# Patient Record
Sex: Male | Born: 1975 | Race: White | Hispanic: No | Marital: Married | Smoking: Never smoker
Health system: Southern US, Community
[De-identification: ages and names within clinical notes are randomized; demographics above are authoritative.]

## PROBLEM LIST (undated history)

## (undated) DIAGNOSIS — E785 Hyperlipidemia, unspecified: Secondary | ICD-10-CM

## (undated) DIAGNOSIS — J45990 Exercise induced bronchospasm: Secondary | ICD-10-CM

## (undated) DIAGNOSIS — E781 Pure hyperglyceridemia: Secondary | ICD-10-CM

## (undated) DIAGNOSIS — G4733 Obstructive sleep apnea (adult) (pediatric): Secondary | ICD-10-CM

## (undated) DIAGNOSIS — Z6834 Body mass index (BMI) 34.0-34.9, adult: Secondary | ICD-10-CM

## (undated) DIAGNOSIS — J309 Allergic rhinitis, unspecified: Secondary | ICD-10-CM

## (undated) DIAGNOSIS — I10 Essential (primary) hypertension: Secondary | ICD-10-CM

## (undated) DIAGNOSIS — F329 Major depressive disorder, single episode, unspecified: Secondary | ICD-10-CM

## (undated) DIAGNOSIS — K219 Gastro-esophageal reflux disease without esophagitis: Secondary | ICD-10-CM

## (undated) DIAGNOSIS — R635 Abnormal weight gain: Secondary | ICD-10-CM

## (undated) DIAGNOSIS — K2210 Ulcer of esophagus without bleeding: Secondary | ICD-10-CM

## (undated) DIAGNOSIS — F411 Generalized anxiety disorder: Secondary | ICD-10-CM

## (undated) DIAGNOSIS — D72819 Decreased white blood cell count, unspecified: Secondary | ICD-10-CM

## (undated) DIAGNOSIS — R002 Palpitations: Secondary | ICD-10-CM

## (undated) DIAGNOSIS — F10280 Alcohol dependence with alcohol-induced anxiety disorder: Secondary | ICD-10-CM

## (undated) DIAGNOSIS — M545 Low back pain, unspecified: Secondary | ICD-10-CM

## (undated) DIAGNOSIS — F32A Depression, unspecified: Secondary | ICD-10-CM

## (undated) HISTORY — DX: Ulcer of esophagus without bleeding: K22.10

## (undated) HISTORY — DX: Gastro-esophageal reflux disease without esophagitis: K21.9

## (undated) HISTORY — DX: Palpitations: R00.2

## (undated) HISTORY — DX: Low back pain, unspecified: M54.50

## (undated) HISTORY — PX: NASAL SINUS SURGERY: SHX719

## (undated) HISTORY — DX: Hyperlipidemia, unspecified: E78.5

## (undated) HISTORY — DX: Generalized anxiety disorder: F41.1

## (undated) HISTORY — DX: Body mass index (BMI) 34.0-34.9, adult: Z68.34

## (undated) HISTORY — DX: Exercise induced bronchospasm: J45.990

## (undated) HISTORY — DX: Alcohol dependence with alcohol-induced anxiety disorder: F10.280

## (undated) HISTORY — DX: Obstructive sleep apnea (adult) (pediatric): G47.33

## (undated) HISTORY — DX: Pure hyperglyceridemia: E78.1

## (undated) HISTORY — DX: Abnormal weight gain: R63.5

## (undated) HISTORY — DX: Decreased white blood cell count, unspecified: D72.819

## (undated) HISTORY — DX: Allergic rhinitis, unspecified: J30.9

## (undated) HISTORY — DX: Essential (primary) hypertension: I10

---

## 2002-03-31 ENCOUNTER — Emergency Department (HOSPITAL_COMMUNITY): Admission: EM | Admit: 2002-03-31 | Discharge: 2002-04-01 | Payer: Self-pay | Admitting: Emergency Medicine

## 2002-04-01 ENCOUNTER — Encounter: Payer: Self-pay | Admitting: Emergency Medicine

## 2006-07-12 ENCOUNTER — Emergency Department (HOSPITAL_COMMUNITY): Admission: EM | Admit: 2006-07-12 | Discharge: 2006-07-13 | Payer: Self-pay | Admitting: Emergency Medicine

## 2008-07-13 ENCOUNTER — Encounter: Admission: RE | Admit: 2008-07-13 | Discharge: 2008-07-13 | Payer: Self-pay | Admitting: Otolaryngology

## 2008-07-13 ENCOUNTER — Emergency Department (HOSPITAL_COMMUNITY): Admission: EM | Admit: 2008-07-13 | Discharge: 2008-07-14 | Payer: Self-pay | Admitting: Emergency Medicine

## 2008-08-28 ENCOUNTER — Ambulatory Visit (HOSPITAL_BASED_OUTPATIENT_CLINIC_OR_DEPARTMENT_OTHER): Admission: RE | Admit: 2008-08-28 | Discharge: 2008-08-28 | Payer: Self-pay | Admitting: Otolaryngology

## 2010-08-05 LAB — POCT HEMOGLOBIN-HEMACUE: Hemoglobin: 15.1 g/dL (ref 13.0–17.0)

## 2010-09-09 NOTE — Op Note (Signed)
Scott Bowman               ACCOUNT NO.:  [id number]   MEDICAL RECORD NO.:  [id number]          PATIENT TYPE:  AMB   LOCATION:  DSC                          FACILITY:  [facility]   PHYSICIAN:  Christopher E. Ezzard Standing, M.D.DATE OF BIRTH:  02-05-1976   DATE OF PROCEDURE:  DATE OF DISCHARGE:                               OPERATIVE REPORT   PREOPERATIVE DIAGNOSES:  1. History of recurrent sinus infections with nasal obstruction.  2. Chronic left maxillary sinus disease.  3. 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   POSTOPERATIVE DIAGNOSES:  1. History of recurrent sinus infections with nasal obstruction.  2. Chronic left maxillary sinus disease.  3. Left maxillary ethmoid sinus disease.  4. Septal deviation and turbinate hypertrophy.   OPERATION PERFORMED:  Septoplasty with bilateral inferior turbinate  reductions.  Left functional endoscopic sinus surgery with left  maxillary ostial enlargement and left anterior ethmoidectomy.   SURGEON:  Kristine Garbe. Ezzard Standing, MD   ANESTHESIA:  General endotracheal.   COMPLICATIONS:  None.   BRIEF CLINICAL NOTE:  Scott Bowman is a 35 year old gentleman who has  had long history of chronic sinus problems worse on the left side.  He  has had a plane sinus x-rays as well as a CT scan of the sinuses that  shows a chronically opacified left maxillary sinus and limited sinus  disease, otherwise.  He also has a significant septal deviation with  bowing of the anterior cartilaginous septum to the right side,  compensatory turbinate hypertrophy on the left side.  He is taken to the  operating room at this time for septoplasty and turbinate reductions to  help improve the nasal airway and left functional endoscopic sinus  surgery because of chronic obstruction of left maxillary sinus.   DESCRIPTION OF PROCEDURE:  After adequate endotracheal anesthesia, the  patient's nose was prepped with Betadine solution and then the nose  was  then further prepped with cotton pledgets soaked in decongestant, and  turbinate, septum, and left middle meatus area was injected with  Xylocaine with epinephrine for hemostasis.  First using the 0- and 30-  degree endoscopes, the left middle meatus was examined.  The uncinate  process was incised with a sickle knife and removed.  The anterior  ethmoid areas were opened up with the small up through-cut and straight  through-cut forceps.  The microdebrider was used to remove some of the  mucosa from the anterior ethmoid area.  Next, using the backbiter the  ostia was identified and was enlarged anteriorly with a backbiter and  then posteriorly with straight through-cuts.  Using a large curved  suction, a large amount very thick mucoid fluid was aspirated from the  left maxillary sinus.  The sinus was then irrigated with irrigation with  2 syringes of 20 mL of saline to wash out the remaining mucus from the  left maxillary sinus.  Dara Lords was enlarged to approximately a centimeter  size.  This completed the left anterior ethmoidectomy and left maxillary  ostial enlargement with drainage of the left maxillary sinus.  Next, a  hemitransfixion incision was made along  the cartilage of the septum on  the right side.  Mucoperichondrium with periosteal flaps were elevated  posteriorly.  The patient had significant deformity of the cartilage off  the maxillary crest on the right side with bowing of the crest and  cartilage into the right airway.  Elevation was made and posteriorly on  either side of the bony septum and the bony septum that protruded into  the right airway along with a spur was removed.  This allowed the septum  to return more to midline.  In order to open up the left nasal airway,  incision was made along the inferior edge of the turbinate, and  submucosal flaps were elevated along the left inferior turbinate to  remove the turbinate bone in the left side.  Submucosal  cauterization  was then performed for hemostasis to shrink the left inferior turbinate  after removing the turbinate bone.  Remaining turbinate was outfractured  and cauterized posteriorly.  On the right side, inferior one-third of  the turbinate was amputated and suction cautery was used to cauterize  the edge of the turbinate.  There was bleeding.  The turbinate bone was  outfractured.  This completed the turbinate reductions.  Hemitransfixion  incision was closed with interrupted 4-0 chromic suture.  The septum was  basted with a 4-0 chromic suture.  Gelfoam was placed in the left middle  meatus as a spacer and for hemostasis.  Telfa soaked in bacitracin  ointment was placed along the floor of the nose along the turbinates  bilaterally.  This completed the procedure.  Hutch was awoken from  anesthesia and transferred to recovery postop doing well.   DISPOSITION:  Deandrew was discharged home later this morning on Keflex 500  mg b.i.d. for 1 week, Tylenol with Vicodin p.r.n. pain.  We will have  him followup in the office tomorrow to have the Telfa pack removed from  the nose.  Also placed him on Keflex 500 mg b.i.d. for 1 week.           ______________________________  Kristine Garbe. Ezzard Standing, M.D.     CEN/MEDQ  D:  08/28/2008  T:  08/29/2008  Job:  [id number]   cc:   Louanna Raw

## 2010-09-09 NOTE — Consult Note (Signed)
NAMEKEISTON, MANLEY               ACCOUNT NO.:  [id number]   MEDICAL RECORD NO.:  [id number]          PATIENT TYPE:  EMS   LOCATION:  ED                           FACILITY:  [facility]   PHYSICIAN:  Dionne Ano. Gramig, M.D.DATE OF BIRTH:  [birth year]   DATE OF CONSULTATION:  DATE OF DISCHARGE:  07/14/2008                                 CONSULTATION   HISTORY OF PRESENT ILLNESS:  Marquest was playing with his daughter and  sustained a contusive injury to his right thenar base.  He has had  swelling.  There was concern of a possible compartment syndrome and I  was asked to see him.  I appreciate the opportunity to see him.  I have  discussed with he and his wife his injury mechanism, etc.   PAST MEDICAL HISTORY:  At the present time, the patient is noted to have  no significant past medical history other than recent sinusitis  requiring amoxicillin. He takes no other medicines.   ALLERGIES:  He has no known drug allergies.   PAST SURGICAL HISTORY:  He has a history of surgical tonsillectomy.   SOCIAL HISTORY:  He occasionally enjoys an alcoholic beverage and does  not smoke.   EXAMINATION:  GENERAL:  He is alert and oriented, in no acute distress.  VITAL SIGNS:  Stable.  The patient has an area of contusive injury to the thenar base with  swelling.  His compartments are soft.  He is able to flex and extend the  digit.  He has intact sensation to the hand and refill.  With extreme  passive extension, he has a bit of discomfort, but not excruciating  compartment syndrome pain.  His compartments are palpable and soft.  I  have reviewed this carpal tunnel as quiescent, mid palmar space and  hypothenar space are stable, there are no signs of infection.  The upper  arm is stable.   X-RAY:  Shows a negative fracture-dislocation or specifying lesion.   IMPRESSION:  Contusive injury to the thumb.   PLAN:  I have discussed with he and his wife these findings. I would  note he has an contusive  injury.  Intact x-rays without fracture  dislocation.  At the present time, I would recommend ice, elevation,  gentle interval range of motion.  Return to the office to see me at 4:00  p.m.  I have discussed with wife sinister signs such as extreme pain on  passive extension, loss of refill, loss of sensation and tight  compartments.  I will have them check  just throughout the weekend.  I  have given them a card and discussed with them I would be happy to talk  or see them immediately if his condition worsens.  Should any problems  occur, they will notify me.  Otherwise, we look forward to seeing them  back in the office Monday to make sure he is improved.  I have asked him  not to perform any drumming activities, no lifting or compression point  twisting, and to curtail all activities in terms of heavy work duties.  It was  a pleasure seeing him today.  All questions have been encouraged  and answered.      Dionne Ano. Amanda Pea, M.D.  Electronically Signed     WMG/MEDQ  D:  07/14/2008  T:  07/14/2008  Job:  [id number]

## 2013-02-06 ENCOUNTER — Other Ambulatory Visit (HOSPITAL_COMMUNITY): Payer: Self-pay | Admitting: Family Medicine

## 2013-02-06 DIAGNOSIS — R1310 Dysphagia, unspecified: Secondary | ICD-10-CM

## 2013-02-09 ENCOUNTER — Ambulatory Visit (HOSPITAL_COMMUNITY)
Admission: RE | Admit: 2013-02-09 | Discharge: 2013-02-09 | Disposition: A | Discharge status: Home / Self Care | Payer: BC Managed Care – PPO | Source: Ambulatory Visit | Attending: Family Medicine | Admitting: Family Medicine

## 2013-02-09 ENCOUNTER — Ambulatory Visit (HOSPITAL_COMMUNITY)
Admission: RE | Admit: 2013-02-09 | Discharge: 2013-02-09 | Disposition: A | Discharge status: Home / Self Care | Payer: Self-pay | Source: Ambulatory Visit | Attending: Family Medicine | Admitting: Family Medicine

## 2013-02-09 DIAGNOSIS — R1310 Dysphagia, unspecified: Secondary | ICD-10-CM | POA: Insufficient documentation

## 2013-02-09 DIAGNOSIS — F458 Other somatoform disorders: Secondary | ICD-10-CM | POA: Insufficient documentation

## 2013-02-09 DIAGNOSIS — R1319 Other dysphagia: Secondary | ICD-10-CM | POA: Insufficient documentation

## 2013-02-09 DIAGNOSIS — K219 Gastro-esophageal reflux disease without esophagitis: Secondary | ICD-10-CM | POA: Insufficient documentation

## 2013-02-09 NOTE — Procedures (Signed)
Objective Swallowing Evaluation: Modified Barium Swallowing Study  Patient Details  Name: Scott Bowman MRN: [id number] Date of Birth: [birth year]  Today's Date: 02/09/2013 Time:  -     Past Medical History: No past medical history on file. Past Surgical History: No past surgical history on file. HPI:  Pt is a 37 year old in normal health arriving for an outpatient MBS due to event where Heimlich manuever required to dislogde chicken sandwich from airway. Pt also reports globus sensation with solid food that clears with sips of thin liquids. Hx of GERD, now resolved with medical mgmt per pt.      Assessment / Plan / Recommendation Clinical Impression  Dysphagia Diagnosis: Suspected primary esophageal dysphagia;Within Functional Limits Clinical impression: Pt presents with normal oral and oropharyngeal function. No motor or sensory deficits that would account for blockage of upper aiway. Esophageal sweep however did reveal appearance of narrowing, with barium tablet lodged at that site. No radiologist was present to confirm. Recommend pt continue regular solids and thin liquids. F/u with GI for esophageal assessment.     Treatment Recommendation  No treatment recommended at this time    Diet Recommendation Regular;Thin liquid   Liquid Administration via: Cup;Straw Medication Administration: Whole meds with liquid Supervision: Patient able to self feed    Other  Recommendations Recommended Consults: Consider GI evaluation;Consider esophageal assessment Oral Care Recommendations: Patient independent with oral care   Follow Up Recommendations  None    Frequency and Duration        Pertinent Vitals/Pain NA    SLP Swallow Goals     General HPI: Pt is a 37 year old in normal health arriving for an outpatient MBS due to event where Heimlich manuever required to dislogde chicken sandwich from airway. Pt also reports globus sensation with solid food that clears with sips of thin  liquids. Hx of GERD, now resolved with medical mgmt per pt.  Type of Study: Modified Barium Swallowing Study Reason for Referral: Objectively evaluate swallowing function Diet Prior to this Study: Regular;Thin liquids Temperature Spikes Noted: No Respiratory Status: Room air History of Recent Intubation: No Behavior/Cognition: Alert;Cooperative;Pleasant mood Oral Cavity - Dentition: Adequate natural dentition Oral Motor / Sensory Function: Within functional limits Self-Feeding Abilities: Able to feed self Patient Positioning: Upright in chair Baseline Vocal Quality: Clear Volitional Cough: Strong Volitional Swallow: Able to elicit Anatomy: Within functional limits Pharyngeal Secretions: Not observed secondary MBS    Reason for Referral Objectively evaluate swallowing function   Oral Phase Oral Preparation/Oral Phase Oral Phase: WFL   Pharyngeal Phase Pharyngeal Phase Pharyngeal Phase: Within functional limits  Cervical Esophageal Phase    GO    Cervical Esophageal Phase Cervical Esophageal Phase: Impaired Cervical Esophageal Phase - Comment Cervical Esophageal Comment: Appearance of narrow area just above GE junction, barium tablet persistently lodged there despite liquid and solid boluses.     Functional Assessment Tool Used: clinical judgement Functional Limitations: Swallowing Swallow Current Status (W0981): 0 percent impaired, limited or restricted Swallow Goal Status (X9147): 0 percent impaired, limited or restricted Swallow Discharge Status [id number]): 0 percent impaired, limited or restricted   [facility], MA CCC-SLP [phone]  Scott Bowman 02/09/2013, 2:36 PM

## 2013-02-22 ENCOUNTER — Other Ambulatory Visit: Payer: Self-pay | Admitting: Gastroenterology

## 2013-02-22 DIAGNOSIS — R1310 Dysphagia, unspecified: Secondary | ICD-10-CM

## 2013-02-28 ENCOUNTER — Ambulatory Visit
Admission: RE | Admit: 2013-02-28 | Discharge: 2013-02-28 | Disposition: A | Discharge status: Home / Self Care | Payer: BC Managed Care – PPO | Source: Ambulatory Visit | Attending: Gastroenterology | Admitting: Gastroenterology

## 2013-02-28 DIAGNOSIS — R1310 Dysphagia, unspecified: Secondary | ICD-10-CM

## 2013-05-01 ENCOUNTER — Emergency Department (HOSPITAL_COMMUNITY): Payer: BC Managed Care – PPO

## 2013-05-01 ENCOUNTER — Emergency Department (HOSPITAL_COMMUNITY)
Admission: EM | Admit: 2013-05-01 | Discharge: 2013-05-01 | Disposition: A | Discharge status: Home / Self Care | Payer: BC Managed Care – PPO | Attending: Emergency Medicine | Admitting: Emergency Medicine

## 2013-05-01 ENCOUNTER — Encounter (HOSPITAL_COMMUNITY): Payer: Self-pay | Admitting: Emergency Medicine

## 2013-05-01 DIAGNOSIS — Z79899 Other long term (current) drug therapy: Secondary | ICD-10-CM | POA: Insufficient documentation

## 2013-05-01 DIAGNOSIS — R209 Unspecified disturbances of skin sensation: Secondary | ICD-10-CM | POA: Insufficient documentation

## 2013-05-01 DIAGNOSIS — R0789 Other chest pain: Secondary | ICD-10-CM | POA: Insufficient documentation

## 2013-05-01 DIAGNOSIS — R0602 Shortness of breath: Secondary | ICD-10-CM | POA: Insufficient documentation

## 2013-05-01 DIAGNOSIS — Z881 Allergy status to other antibiotic agents status: Secondary | ICD-10-CM | POA: Insufficient documentation

## 2013-05-01 DIAGNOSIS — R55 Syncope and collapse: Secondary | ICD-10-CM | POA: Insufficient documentation

## 2013-05-01 DIAGNOSIS — R42 Dizziness and giddiness: Secondary | ICD-10-CM | POA: Insufficient documentation

## 2013-05-01 DIAGNOSIS — R002 Palpitations: Secondary | ICD-10-CM | POA: Insufficient documentation

## 2013-05-01 LAB — CBC
HCT: 42.1 % (ref 39.0–52.0)
HEMOGLOBIN: 15.5 g/dL (ref 13.0–17.0)
MCH: 32.6 pg (ref 26.0–34.0)
MCHC: 36.8 g/dL — AB (ref 30.0–36.0)
MCV: 88.4 fL (ref 78.0–100.0)
Platelets: 137 10*3/uL — ABNORMAL LOW (ref 150–400)
RBC: 4.76 MIL/uL (ref 4.22–5.81)
RDW: 12.3 % (ref 11.5–15.5)
WBC: 4.9 10*3/uL (ref 4.0–10.5)

## 2013-05-01 LAB — BASIC METABOLIC PANEL
BUN: 18 mg/dL (ref 6–23)
CO2: 22 meq/L (ref 19–32)
Calcium: 9.2 mg/dL (ref 8.4–10.5)
Chloride: 103 mEq/L (ref 96–112)
Creatinine, Ser: 0.97 mg/dL (ref 0.50–1.35)
GFR calc Af Amer: >90 mL/min (ref >90)
GFR calc non Af Amer: >90 mL/min (ref >90)
GLUCOSE: 125 mg/dL — AB (ref 70–99)
POTASSIUM: 3.9 meq/L (ref 3.7–5.3)
Sodium: 141 mEq/L (ref 137–147)

## 2013-05-01 LAB — POCT I-STAT TROPONIN I
TROPONIN I, POC: 0 ng/mL (ref 0.00–0.08)
Troponin i, poc: 0.01 ng/mL (ref 0.00–0.08)

## 2013-05-01 LAB — PRO B NATRIURETIC PEPTIDE: Pro B Natriuretic peptide (BNP): 80.5 pg/mL (ref 0–125)

## 2013-05-01 NOTE — ED Provider Notes (Signed)
CSN: [id number]     Arrival date & time 05/01/13  1500 History   First MD Initiated Contact with Patient 05/01/13 1902     Chief Complaint  Patient presents with  . Near Syncope  . Palpitations  . 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   Patient with 3 episodes over last 3 weeks. Patient with tingling, shortness of breath, chest pressure, palpitations, lasting for 20 minutes, resolving with rest, no other ass'd signs/symptoms. No prior treatments for this issue. No current chest pain / pressure / SOB. No radiation of symptoms.   History reviewed. No pertinent past medical history. History reviewed. No pertinent past surgical history. History reviewed. No pertinent family history. History  Substance Use Topics  . Smoking status: Never Smoker   . Smokeless tobacco: Not on file  . Alcohol Use: Yes    Review of Systems  Constitutional: Negative for fever and chills.  HENT: Negative for sore throat.   Eyes: Negative for pain.  Respiratory: Negative for cough and shortness of breath.   Cardiovascular: Negative for chest pain.  Gastrointestinal: Negative for nausea, vomiting and abdominal pain.  Genitourinary: Negative for dysuria and flank pain.  Musculoskeletal: Negative for back pain and neck pain.  Skin: Negative for rash.  Neurological: Negative for seizures and headaches.    Allergies  Amoxicillin  Home Medications   Current Outpatient Rx  Name  Route  Sig  Dispense  Refill  . albuterol (PROVENTIL HFA;VENTOLIN HFA) 108 (90 BASE) MCG/ACT inhaler   Inhalation   Inhale 1-2 puffs into the lungs every 6 (six) hours as needed for wheezing or shortness of breath.         . esomeprazole (NEXIUM) 20 MG capsule   Oral   Take 40 mg by mouth daily at 12 noon.         . Multiple Vitamin (MULTIVITAMIN WITH  MINERALS) TABS tablet   Oral   Take 1 tablet by mouth daily.          BP 126/87  Pulse 68  Temp(Src) 98 F (36.7 C) (Oral)  Resp 14  Wt 195 lb (88.451 kg)  SpO2 99% Physical Exam  Constitutional: He is oriented to person, place, and time. He appears well-developed and well-nourished. No distress.  HENT:  Head: Normocephalic and atraumatic.  Eyes: Pupils are equal, round, and reactive to light.  Neck: Normal range of motion.  Cardiovascular: Normal rate and regular rhythm.   Pulmonary/Chest: Effort normal and breath sounds normal.  Abdominal: Soft. He exhibits no distension. There is no tenderness.  Musculoskeletal: Normal range of motion.  Neurological: He is alert and oriented to person, place, and time.  Skin: Skin is warm. He is not diaphoretic.    ED Course  Procedures (including critical care time) Labs Review Labs Reviewed  CBC - Abnormal; Notable for the following:    MCHC 36.8 (*)    Platelets 137 (*)    All other components within normal limits  BASIC METABOLIC PANEL - Abnormal; Notable for the following:    Glucose, Bld 125 (*)    All other components within normal limits  PRO B NATRIURETIC PEPTIDE  POCT I-STAT TROPONIN I  POCT I-STAT TROPONIN I   Imaging Review Dg Chest 2 View  05/01/2013   CLINICAL DATA:  Near syncope.  Palpitations and short of breath  EXAM: CHEST  2 VIEW  COMPARISON:  None.  FINDINGS: The heart size and mediastinal contours are within normal limits. Both lungs are clear. The visualized skeletal structures are unremarkable.  IMPRESSION: No active cardiopulmonary disease.   Electronically Signed   By: Marlan Palau M.D.   On: 05/01/2013 15:43    EKG Interpretation    Date/Time:  Monday May 01 2013 15:04:07 EST Ventricular Rate:  86 PR Interval:  126 QRS Duration: 80 QT Interval:  364 QTC Calculation: 435 R Axis:   63 Text Interpretation:  Normal sinus rhythm Normal ECG No prior EKG Confirmed by Gwendolyn Grant  MD, BLAIR ([id number]) on  05/01/2013 7:05:38 PM            MDM   1. Near syncope    38 yo M with no sig PMHx who presents with pre-syncopal episodes x3.   EKG with no acute findings, as documented above. CXR normal. Patient with low cardiac risk history. HEART score 0 (History - not concerning, EKG normal, Age <45, Risk factors - none, Troponin - none). No acute chest pain currently. No shortness of breath.   Will recommend continued follow-up with PCP, possible follow-up with cardiologist. Low risk of acute cardiac event given troponin trends. Discussed follow-up, possible need for event monitoring. Discharged to home in stable condition. Patient seen and evaluated by myself and my attending, Dr. Gwendolyn Grant. Strict return precautions given.        Imagene Sheller, MD 05/02/13 0005

## 2013-05-01 NOTE — Discharge Instructions (Signed)
Cardiac Event Monitoring A cardiac event monitor is a small recording device used to help detect abnormal heart rhythms (arrhythmias). The monitor is used to record heart rhythm when noticeable symptoms such as the following occur:  Fast heart beats (palpitations), such as heart racing or fluttering.  Dizziness.  Fainting or lightheadedness.  Unexplained weakness. The monitor is wired to two electrodes placed on your chest. Electrodes are flat, sticky disks that attach to your skin. The monitor can be worn for up to 30 days. You will wear the monitor at all times, except when bathing.  HOW TO USE YOUR CARDIAC EVENT MONITOR A technician will prepare your chest for the electrode placement. The technician will show you how to place the electrodes, how to work the monitor, and how to replace the batteries. Take time to practice using the monitor before you leave the office. Make sure you understand how to send the information from the monitor to your health care provider. This requires a telephone with a landline, not a cellphone. You need to:  Wear your monitor at all times, except when you are in water:  Do not get the monitor wet.  Take the monitor off when bathing. Do not swim or use a hot tub with it on.  Keep your skin clean. Do not put body lotion or moisturizer on your chest.  Change the electrodes daily or any time they stop sticking to your skin. You might need to use tape to keep them on.  It is possible that your skin under the electrodes could become irritated. To keep this from happening, try to put the electrodes in slightly different places on your chest. However, they must remain in the area under your left breast and in the upper right section of your chest.  Make sure the monitor is safely clipped to your clothing or in a location close to your body that your health care provider recommends.  Press the button to record when you feel symptoms of heart trouble, such as  dizziness, weakness, lightheadedness, palpitations, thumping, shortness of breath, unexplained weakness, or a fluttering or racing heart. The monitor is always on and records what happened slightly before you pressed the button, so do not worry about being too late to get good information.  Keep a diary of your activities, such as walking, doing chores, and taking medicine. It is especially important to note what you were doing when you pushed the button to record your symptoms. This will help your health care provider determine what might be contributing to your symptoms. The information stored in your monitor will be reviewed by your health care provider alongside your diary entries.  Send the recorded information as recommended by your health care provider. It is important to understand that it will take some time for your health care provider to process the results.  Change the batteries as recommended by your health care provider. SEEK IMMEDIATE MEDICAL CARE IF:   You have chest pain.  You have extreme difficulty breathing or shortness of breath.  You develop a very fast heartbeat that persists.  You develop dizziness that does not go away .  You faint or constantly feel you are about to faint. Document Released: 01/21/2008 Document Revised: 12/14/2012 Document Reviewed: 10/10/2012 [employer] Patient Information 2014 [employer].  

## 2013-05-01 NOTE — ED Notes (Signed)
Pt c/o palpitations with near syncope and SOB starting today; pt sts some SOB lately more than norm; regular rhythm noted at present

## 2013-05-02 NOTE — ED Provider Notes (Signed)
I saw and evaluated the patient, reviewed the resident's note and I agree with the findings and plan.  EKG Interpretation    Date/Time:  Monday May 01 2013 15:04:07 EST Ventricular Rate:  86 PR Interval:  126 QRS Duration: 80 QT Interval:  364 QTC Calculation: 435 R Axis:   63 Text Interpretation:  Normal sinus rhythm Normal ECG No prior EKG Confirmed by Gwendolyn GrantWALDEN  MD, John Vasconcelos ([id number]) on 05/01/2013 7:05:38 PM            Patient here with 3 near syncopal episodes. He's had one week over the past week. He states heart racing with these episodes. He denies any chest pain, shortness of breath. He has some mild dizziness during this time. He denies any IV drug use, illicit drug use, caffeine, any drugs, withdrawal from drugs. Concerned patient may be having arrhythmias. Instructed to use a Holter monitor and given PCP followup for this. Stable for discharge  Dagmar HaitWilliam Ruairi Stutsman, MD 05/02/13 [id number]

## 2014-06-26 ENCOUNTER — Emergency Department (HOSPITAL_COMMUNITY)
Admission: EM | Admit: 2014-06-26 | Discharge: 2014-06-26 | Disposition: A | Discharge status: Home / Self Care | Payer: BLUE CROSS/BLUE SHIELD | Attending: Emergency Medicine | Admitting: Emergency Medicine

## 2014-06-26 ENCOUNTER — Encounter (HOSPITAL_COMMUNITY): Payer: Self-pay | Admitting: Emergency Medicine

## 2014-06-26 DIAGNOSIS — Z79899 Other long term (current) drug therapy: Secondary | ICD-10-CM | POA: Diagnosis not present

## 2014-06-26 DIAGNOSIS — F329 Major depressive disorder, single episode, unspecified: Secondary | ICD-10-CM | POA: Insufficient documentation

## 2014-06-26 DIAGNOSIS — F32A Depression, unspecified: Secondary | ICD-10-CM

## 2014-06-26 DIAGNOSIS — Z880 Allergy status to penicillin: Secondary | ICD-10-CM | POA: Insufficient documentation

## 2014-06-26 HISTORY — DX: Depression, unspecified: F32.A

## 2014-06-26 HISTORY — DX: Major depressive disorder, single episode, unspecified: F32.9

## 2014-06-26 LAB — RAPID URINE DRUG SCREEN, HOSP PERFORMED
Amphetamines: NOT DETECTED
BENZODIAZEPINES: NOT DETECTED
Barbiturates: NOT DETECTED
Cocaine: NOT DETECTED
OPIATES: NOT DETECTED
Tetrahydrocannabinol: NOT DETECTED

## 2014-06-26 LAB — BASIC METABOLIC PANEL
ANION GAP: 10 (ref 5–15)
BUN: 21 mg/dL (ref 6–23)
CALCIUM: 10.4 mg/dL (ref 8.4–10.5)
CO2: 27 mmol/L (ref 19–32)
CREATININE: 0.96 mg/dL (ref 0.50–1.35)
Chloride: 104 mmol/L (ref 96–112)
Glucose, Bld: 108 mg/dL — ABNORMAL HIGH (ref 70–99)
Potassium: 4.8 mmol/L (ref 3.5–5.1)
SODIUM: 141 mmol/L (ref 135–145)

## 2014-06-26 LAB — CBC WITH DIFFERENTIAL/PLATELET
Basophils Absolute: 0 10*3/uL (ref 0.0–0.1)
Basophils Relative: 0 % (ref 0–1)
Eosinophils Absolute: 0 10*3/uL (ref 0.0–0.7)
Eosinophils Relative: 1 % (ref 0–5)
HCT: 46.5 % (ref 39.0–52.0)
HEMOGLOBIN: 16.3 g/dL (ref 13.0–17.0)
LYMPHS ABS: 1 10*3/uL (ref 0.7–4.0)
Lymphocytes Relative: 25 % (ref 12–46)
MCH: 32 pg (ref 26.0–34.0)
MCHC: 35.1 g/dL (ref 30.0–36.0)
MCV: 91.2 fL (ref 78.0–100.0)
Monocytes Absolute: 0.3 10*3/uL (ref 0.1–1.0)
Monocytes Relative: 7 % (ref 3–12)
NEUTROS ABS: 2.7 10*3/uL (ref 1.7–7.7)
NEUTROS PCT: 67 % (ref 43–77)
Platelets: 152 10*3/uL (ref 150–400)
RBC: 5.1 MIL/uL (ref 4.22–5.81)
RDW: 12.3 % (ref 11.5–15.5)
WBC: 4.1 10*3/uL (ref 4.0–10.5)

## 2014-06-26 LAB — ETHANOL: Alcohol, Ethyl (B): <5 mg/dL (ref 0–9)

## 2014-06-26 NOTE — ED Provider Notes (Signed)
CSN: [id number]     Arrival date & time 06/26/14  1056 History  This chart was scribed for non-physician practitioner, Teressa Lower, NP working with Layla Maw Ward, DO by Greggory Stallion, ED scribe. This patient was seen in room [facility]3/[facility] and the patient's care was started at 11:59 AM.   Chief Complaint  Patient presents with  . Depression   The history is provided by the patient. No language interpreter was used.    HPI Comments: Scott Bowman is a 39 y.o. male who presents to the Emergency Department complaining of increased depression over the last week. He also reports increased sleepiness and some trouble sleeping. Pt was evaluated by his PCP 6 days ago and was started on Wellbutrin. He has been taking it daily as prescribed. Pt had depression when he was younger and saw a therapist but states it eventually got better. He denies any new stressors. Pt denies chest pain, SOB, abdominal pain. He denies SI/HI. Pt denies history of alcohol or drug use.   Past Medical History  Diagnosis Date  . Depression    Past Surgical History  Procedure Laterality Date  . Nasal sinus surgery     No family history on file. History  Substance Use Topics  . Smoking status: Never Smoker   . Smokeless tobacco: Not on file  . Alcohol Use: Yes    Review of Systems  Respiratory: Negative for shortness of breath.   Cardiovascular: Negative for chest pain.  Gastrointestinal: Negative for abdominal pain.  Psychiatric/Behavioral: Positive for dysphoric mood. Negative for suicidal ideas.  All other systems reviewed and are negative.  Allergies  Amoxicillin  Home Medications   Prior to Admission medications   Medication Sig Start Date End Date Taking? Authorizing Provider  albuterol (PROVENTIL HFA;VENTOLIN HFA) 108 (90 BASE) MCG/ACT inhaler Inhale 1-2 puffs into the lungs every 6 (six) hours as needed for wheezing or shortness of breath.   Yes Historical Provider, MD  buPROPion (WELLBUTRIN XL) 150  MG 24 hr tablet Take 300 mg by mouth every morning.   Yes Historical Provider, MD  clonazePAM (KLONOPIN) 0.5 MG tablet Take 0.5 mg by mouth at bedtime.   Yes Historical Provider, MD  esomeprazole (NEXIUM) 20 MG capsule Take 40 mg by mouth at bedtime.    Yes Historical Provider, MD  Multiple Vitamin (MULTIVITAMIN WITH MINERALS) TABS tablet Take 1 tablet by mouth daily.   Yes Historical Provider, MD   BP 139/100 mmHg  Pulse 75  Temp(Src) 98.4 F (36.9 C) (Oral)  Resp 16  SpO2 100% Physical Exam  Constitutional: He is oriented to person, place, and time. He appears well-developed and well-nourished. No distress.  HENT:  Head: Normocephalic and atraumatic.  Eyes: Conjunctivae and EOM are normal.  Neck: Neck supple. No tracheal deviation present.  Cardiovascular: Normal rate, regular rhythm and normal heart sounds.   Pulmonary/Chest: Effort normal and breath sounds normal. No respiratory distress.  Musculoskeletal: Normal range of motion.  Neurological: He is alert and oriented to person, place, and time.  Skin: Skin is warm and dry.  Psychiatric: His behavior is normal. He exhibits a depressed mood. He expresses no homicidal ideation. He expresses no suicidal plans and no homicidal plans.  Nursing note and vitals reviewed.   ED Course  Procedures (including critical care time)  DIAGNOSTIC STUDIES: Oxygen Saturation is 100% on RA, normal by my interpretation.    COORDINATION OF CARE: 12:02 PM-Discussed treatment plan which includes speaking with behavioral health with pt at  bedside and pt agreed to plan.   Labs Review Labs Reviewed  BASIC METABOLIC PANEL - Abnormal; Notable for the following:    Glucose, Bld 108 (*)    All other components within normal limits  CBC WITH DIFFERENTIAL/PLATELET  URINE RAPID DRUG SCREEN (HOSP PERFORMED)  ETHANOL    Imaging Review No results found.   EKG Interpretation None      MDM   Final diagnoses:  Depression    tts recommending  out pt counseling. Pt to continue wellbutrin as not enough time has elapsed  I personally performed the services described in this documentation, which was scribed in my presence. The recorded information has been reviewed and is accurate.   Teressa LowerVrinda Vernadette Stutsman, NP 06/26/14 [id number]  Tilden FossaElizabeth Rees, MD 06/26/14 2024

## 2014-06-26 NOTE — Discharge Instructions (Signed)
You appointment is tomorrow at 11am as discussed Depression Depression refers to feeling sad, low, down in the dumps, blue, gloomy, or empty. In general, there are two kinds of depression: 1. Normal sadness or normal grief. This kind of depression is one that we all feel from time to time after upsetting life experiences, such as the loss of a job or the ending of a relationship. This kind of depression is considered normal, is short lived, and resolves within a few days to 2 weeks. Depression experienced after the loss of a loved one (bereavement) often lasts longer than 2 weeks but normally gets better with time. 2. Clinical depression. This kind of depression lasts longer than normal sadness or normal grief or interferes with your ability to function at home, at work, and in school. It also interferes with your personal relationships. It affects almost every aspect of your life. Clinical depression is an illness. Symptoms of depression can also be caused by conditions other than those mentioned above, such as:  Physical illness. Some physical illnesses, including underactive thyroid gland (hypothyroidism), severe anemia, specific types of cancer, diabetes, uncontrolled seizures, heart and lung problems, strokes, and chronic pain are commonly associated with symptoms of depression.  Side effects of some prescription medicine. In some people, certain types of medicine can cause symptoms of depression.  Substance abuse. Abuse of alcohol and illicit drugs can cause symptoms of depression. SYMPTOMS Symptoms of normal sadness and normal grief include the following:  Feeling sad or crying for short periods of time.  Not caring about anything (apathy).  Difficulty sleeping or sleeping too much.  No longer able to enjoy the things you used to enjoy.  Desire to be by oneself all the time (social isolation).  Lack of energy or motivation.  Difficulty concentrating or remembering.  Change in  appetite or weight.  Restlessness or agitation. Symptoms of clinical depression include the same symptoms of normal sadness or normal grief and also the following symptoms:  Feeling sad or crying all the time.  Feelings of guilt or worthlessness.  Feelings of hopelessness or helplessness.  Thoughts of suicide or the desire to harm yourself (suicidal ideation).  Loss of touch with reality (psychotic symptoms). Seeing or hearing things that are not real (hallucinations) or having false beliefs about your life or the people around you (delusions and paranoia). DIAGNOSIS  The diagnosis of clinical depression is usually based on how bad the symptoms are and how long they have lasted. Your health care provider will also ask you questions about your medical history and substance use to find out if physical illness, use of prescription medicine, or substance abuse is causing your depression. Your health care provider may also order blood tests. TREATMENT  Often, normal sadness and normal grief do not require treatment. However, sometimes antidepressant medicine is given for bereavement to ease the depressive symptoms until they resolve. The treatment for clinical depression depends on how bad the symptoms are but often includes antidepressant medicine, counseling with a mental health professional, or both. Your health care provider will help to determine what treatment is best for you. Depression caused by physical illness usually goes away with appropriate medical treatment of the illness. If prescription medicine is causing depression, talk with your health care provider about stopping the medicine, decreasing the dose, or changing to another medicine. Depression caused by the abuse of alcohol or illicit drugs goes away when you stop using these substances. Some adults need professional help in order to  stop drinking or using drugs. SEEK IMMEDIATE MEDICAL CARE IF:  You have thoughts about hurting  yourself or others.  You lose touch with reality (have psychotic symptoms).  You are taking medicine for depression and have a serious side effect. FOR MORE INFORMATION  National Alliance on Mental Illness: www.nami.[employer]  National Institute of Mental Health: http://www.maynard.net/www.nimh.nih.gov Document Released: 04/10/2000 Document Revised: 08/28/2013 Document Reviewed: 07/13/2011 [facility] Patient Information 2015 [place]. This information is not intended to replace advice given to you by your health care provider. Make sure you discuss any questions you have with your health care provider.

## 2014-06-26 NOTE — BH Assessment (Addendum)
Assessment Note  Scott RavensScott T Bowman is an 39 y.o. male with history of depression. Patient presents to complaining of increased depression over the last month. He also reports increased sleepiness, fatigue, despondence, crying spells, and hopelessness. Pt sts, "I have trouble functioning, doing things with my kids, and the things I use to do". Spouse with patient sts that patients memory has also been affected. Sts, "He carries conversations fine but doesn't recall them later".  Patient has some difficulty falling asleep but does report 8 hrs of sleep per night. His appetite is fair. He has loss a total of 25 pounds in the past 5-6 weeks but stating it was intentional. Pt was evaluated by his PCP 6 days ago and was started on Wellbutrin. He has been taking it daily as prescribed. Pt had depression when he was younger and saw a therapist but states it eventually got better. He does not identify and stressors at this time. Patient denies SI, HI,and AVH's.  Patient does not have any current outpatient providers.   Axis I: Depressive Disorder NOS Axis II: Deferred Axis III:  Past Medical History  Diagnosis Date  . Depression    Axis IV: other psychosocial or environmental problems, problems related to social environment, problems with access to health care services and problems with primary support group Axis V: 31-40 impairment in reality testing  Past Medical History:  Past Medical History  Diagnosis Date  . Depression     Past Surgical History  Procedure Laterality Date  . Nasal sinus surgery      Family History: No family history on file.  Social History:  reports that he has never smoked. He does not have any smokeless tobacco history on file. He reports that he drinks alcohol. He reports that he does not use illicit drugs.  Additional Social History:  Alcohol / Drug Use Pain Medications: SEE MAR Prescriptions: SEE MAR Over the Counter: SEE MAR History of alcohol / drug use?: No  history of alcohol / drug abuse  CIWA: CIWA-Ar BP: 139/100 mmHg Pulse Rate: 75 COWS:    Allergies:  Allergies  Allergen Reactions  . Amoxicillin Rash    Home Medications:  (Not in a hospital admission)  OB/GYN Status:  No LMP for male patient.  General Assessment Data Location of Assessment: [facility] Is this a Tele or Face-to-Face Assessment?: Face-to-Face Is this an Initial Assessment or a Re-assessment for this encounter?: Initial Assessment Living Arrangements: Other (Comment), Children, Spouse/significant other (spouse and 3 children ) Can pt return to current living arrangement?: No Admission Status: Voluntary Is patient capable of signing voluntary admission?: Yes Transfer from: Acute Hospital Referral Source[facility]Friend     [facility] Living Arrangements: Other (Comment), Children, Spouse/significant other (spouse and 3 children ) Name of Psychiatrist:  (No psychiatrist ) Name of Therapist:  (No therapist )  Education Status Is patient currently in school?: No  Risk to self with the past 6 months Suicidal Ideation: No Suicidal Intent: No Is patient at risk for suicide?: No Suicidal Plan?: No Access to Means: No What has been your use of drugs/alcohol within the last 12 months?:  (n/a) Previous Attempts/Gestures: No How many times?:  (n/a) Other Self Harm Risks:  (n/a) Triggers for Past Attempts: Other (Comment) (no previous attempts and/or attempts) Intentional Self Injurious Behavior: None Family Suicide History: No Recent stressful life event(s): Other (Comment) (none reported ) Persecutory voices/beliefs?: No Depression: Yes Depression Symptoms: Feeling angry/irritable, Feeling worthless/self pity, Loss of interest  in usual pleasures, Guilt, Isolating, Fatigue, Tearfulness, Insomnia, Despondent Substance abuse history and/or treatment for substance abuse?: No Suicide prevention information given to non-admitted patients: Not  applicable  Risk to Others within the past 6 months Homicidal Ideation: No Thoughts of Harm to Others: No Current Homicidal Intent: No Current Homicidal Plan: No Access to Homicidal Means: No Identified Victim:  (n/a) History of harm to others?: No Assessment of Violence: None Noted Violent Behavior Description:  (calm and cooperative ) Does patient have access to weapons?: No Criminal Charges Pending?: No Does patient have a court date: No  Psychosis Hallucinations: None noted Delusions: None noted  Mental Status Report Appear/Hygiene: Unremarkable Eye Contact: Good Motor Activity: Freedom of movement Speech: Logical/coherent Level of Consciousness: Alert Mood: Depressed Affect: Appropriate to circumstance Anxiety Level: None Thought Processes: Coherent, Relevant Judgement: Unimpaired Orientation: Person, Place, Time, Situation Obsessive Compulsive Thoughts/Behaviors: None  Cognitive Functioning Concentration: Decreased Memory: Recent Intact, Remote Intact IQ: Average  ADLScreening [facility] Assessment Services) Patient's cognitive ability adequate to safely complete daily activities?: Yes Patient able to express need for assistance with ADLs?: Yes Independently performs ADLs?: Yes (appropriate for developmental age)  Prior Inpatient Therapy Prior Inpatient Therapy: No Prior Therapy Dates:  (n/a) Prior Therapy Facilty/Provider(s):  (n/aq) Reason for Treatment:  (n/a)  Prior Outpatient Therapy Prior Outpatient Therapy: No Prior Therapy Dates:  (n/a) Prior Therapy Facilty/Provider(s):  (n/a) Reason for Treatment:  (n/a)  ADL Screening (condition at time of admission) Patient's cognitive ability adequate to safely complete daily activities?: Yes Is the patient deaf or have difficulty hearing?: No Does the patient have difficulty seeing, even when wearing glasses/contacts?: No Does the patient have difficulty concentrating, remembering, or making decisions?:  Yes Patient able to express need for assistance with ADLs?: Yes Does the patient have difficulty dressing or bathing?: No Independently performs ADLs?: Yes (appropriate for developmental age) Does the patient have difficulty walking or climbing stairs?: No Weakness of Legs: None Weakness of Arms/Hands: None  Home Assistive Devices/Equipment Home Assistive Devices/Equipment: None          Advance Directives (For Healthcare) Does patient have an advance directive?: No Would patient like information on creating an advanced directive?: No - patient declined information    Additional Information 1:1 In Past 12 Months?: No CIRT Risk: No Elopement Risk: No Does patient have medical clearance?: Yes     Disposition:  Disposition Initial Assessment Completed for this Encounter: Yes Disposition of Patient: Other dispositions, Outpatient treatment  On Site Evaluation by:   Reviewed with Physician:    Octaviano Batty 06/26/2014 1:09 PM

## 2014-06-26 NOTE — ED Notes (Signed)
Pt went to PCP last Wed for depression, no will to be involved on family activities. Pt was started on Wellbutrin everyday and increased dosage today.  Pt went to work then called his wife and told her he had to leave bc he couldn't take it anymore. Pt denies SI/Hi at this time. Pt does have some hopelessness. Pt states that he does use ETOh about everyday but states the amount hasnt increased with these symptoms.

## 2014-06-26 NOTE — BH Assessment (Signed)
Patient does meet criteria for inpatient treatment, per Dr. Ladona Ridgelaylor. No SI, HI, and AVH's. Outpatient therapy was recommended. Writer recommended IOP (patient declined). Writer offered individual therapy. [facility] Appointment scheduled 11am 06/27/2014. [facility] [address] [place] [address]

## 2014-06-26 NOTE — BH Assessment (Signed)
[facility] Assessment Progress Note   Scott Bowman has been notified about the TTS consult.

## 2016-05-22 ENCOUNTER — Other Ambulatory Visit: Payer: Self-pay | Admitting: Gastroenterology

## 2016-05-22 DIAGNOSIS — R1084 Generalized abdominal pain: Secondary | ICD-10-CM

## 2016-05-28 ENCOUNTER — Ambulatory Visit
Admission: RE | Admit: 2016-05-28 | Discharge: 2016-05-28 | Disposition: A | Discharge status: Home / Self Care | Payer: BLUE CROSS/BLUE SHIELD | Source: Ambulatory Visit | Attending: Gastroenterology | Admitting: Gastroenterology

## 2016-05-28 DIAGNOSIS — R1084 Generalized abdominal pain: Secondary | ICD-10-CM

## 2016-05-28 MED ORDER — IOPAMIDOL (ISOVUE-300) INJECTION 61%
100.0000 mL | Freq: Once | INTRAVENOUS | Status: AC | PRN
  Administered 2016-05-28: 100 mL via INTRAVENOUS

## 2017-01-04 ENCOUNTER — Other Ambulatory Visit: Payer: Self-pay | Admitting: Gastroenterology

## 2017-01-04 DIAGNOSIS — R1084 Generalized abdominal pain: Secondary | ICD-10-CM

## 2017-01-11 ENCOUNTER — Ambulatory Visit
Admission: RE | Admit: 2017-01-11 | Discharge: 2017-01-11 | Disposition: A | Discharge status: Home / Self Care | Payer: BLUE CROSS/BLUE SHIELD | Source: Ambulatory Visit | Attending: Gastroenterology | Admitting: Gastroenterology

## 2017-01-11 DIAGNOSIS — R1084 Generalized abdominal pain: Secondary | ICD-10-CM

## 2017-04-14 DIAGNOSIS — G4733 Obstructive sleep apnea (adult) (pediatric): Secondary | ICD-10-CM | POA: Diagnosis not present

## 2017-04-14 DIAGNOSIS — K219 Gastro-esophageal reflux disease without esophagitis: Secondary | ICD-10-CM | POA: Diagnosis not present

## 2017-04-14 DIAGNOSIS — R002 Palpitations: Secondary | ICD-10-CM | POA: Diagnosis not present

## 2017-04-30 DIAGNOSIS — K219 Gastro-esophageal reflux disease without esophagitis: Secondary | ICD-10-CM | POA: Diagnosis not present

## 2017-04-30 DIAGNOSIS — E785 Hyperlipidemia, unspecified: Secondary | ICD-10-CM | POA: Diagnosis not present

## 2017-05-04 DIAGNOSIS — E785 Hyperlipidemia, unspecified: Secondary | ICD-10-CM | POA: Diagnosis not present

## 2017-05-04 DIAGNOSIS — Z23 Encounter for immunization: Secondary | ICD-10-CM | POA: Diagnosis not present

## 2017-05-04 DIAGNOSIS — Z0000 Encounter for general adult medical examination without abnormal findings: Secondary | ICD-10-CM | POA: Diagnosis not present

## 2017-05-04 DIAGNOSIS — K219 Gastro-esophageal reflux disease without esophagitis: Secondary | ICD-10-CM | POA: Diagnosis not present

## 2017-06-03 DIAGNOSIS — G4733 Obstructive sleep apnea (adult) (pediatric): Secondary | ICD-10-CM | POA: Diagnosis not present

## 2017-06-03 DIAGNOSIS — R5383 Other fatigue: Secondary | ICD-10-CM | POA: Diagnosis not present

## 2017-09-02 DIAGNOSIS — G4733 Obstructive sleep apnea (adult) (pediatric): Secondary | ICD-10-CM | POA: Diagnosis not present

## 2017-09-02 DIAGNOSIS — R5383 Other fatigue: Secondary | ICD-10-CM | POA: Diagnosis not present

## 2017-12-13 DIAGNOSIS — G4733 Obstructive sleep apnea (adult) (pediatric): Secondary | ICD-10-CM | POA: Diagnosis not present

## 2017-12-13 DIAGNOSIS — R5383 Other fatigue: Secondary | ICD-10-CM | POA: Diagnosis not present

## 2017-12-14 DIAGNOSIS — K209 Esophagitis, unspecified: Secondary | ICD-10-CM | POA: Diagnosis not present

## 2017-12-17 IMAGING — CT CT ABD-PELV W/ CM
2 of 5 series · 16 of 46 positions shown, 18 images · IV contrast (agent unspecified)
Comparison: None.

CLINICAL DATA: Patient with left-sided abdominal pain. Pain for 3
weeks.

EXAM:
CT ABDOMEN AND PELVIS WITH CONTRAST
TECHNIQUE: Multidetector CT imaging of the abdomen and pelvis was performed
using the standard protocol following bolus administration of
intravenous contrast.
CONTRAST:  125 cc [id number]

[Series 2: abd/pelvis w/cm · axial · 0.77mm/px · z∈[-689,-254]mm · 13 of 99 slices shown, 15 images]
[im 6/99  soft-tissue]
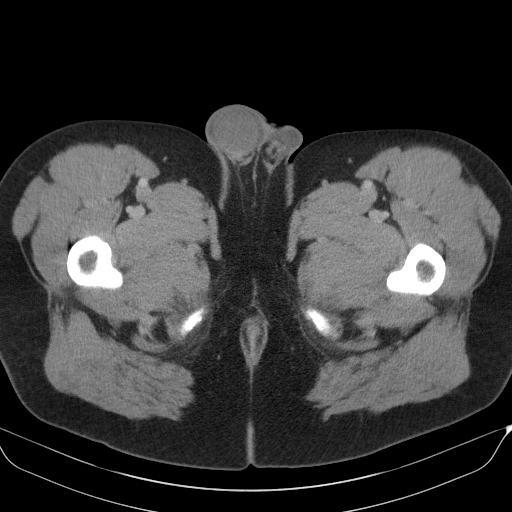
[im 6/99  bone]
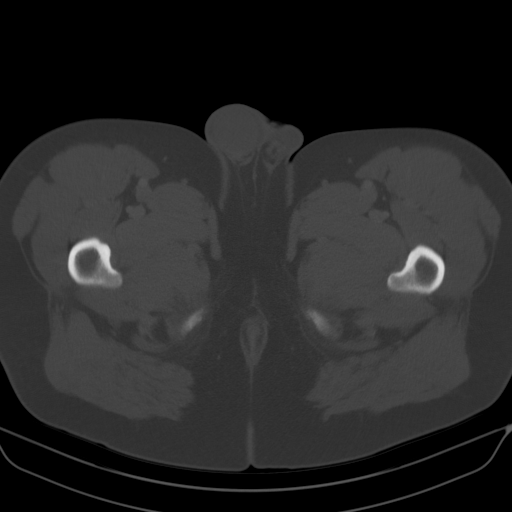
[im 16/99  soft-tissue]
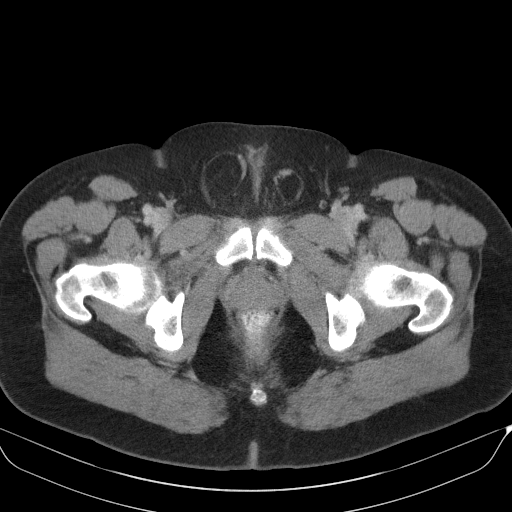
[im 21/99  soft-tissue]
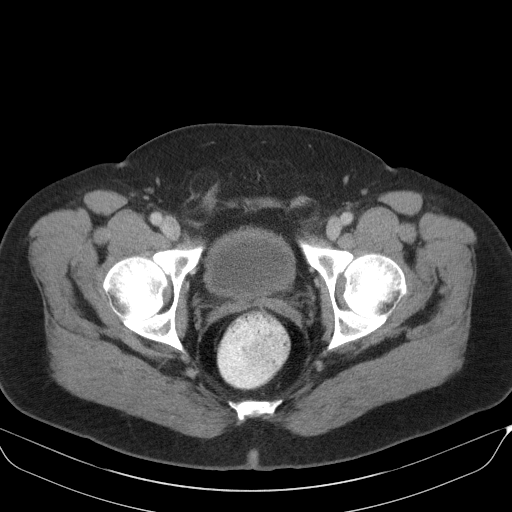
[im 26/99  soft-tissue]
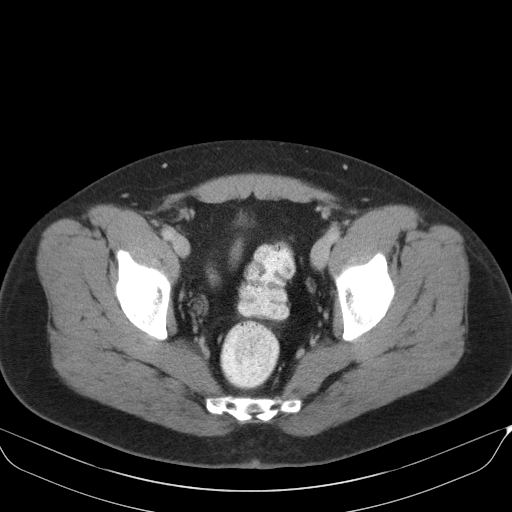
[im 37/99  soft-tissue]
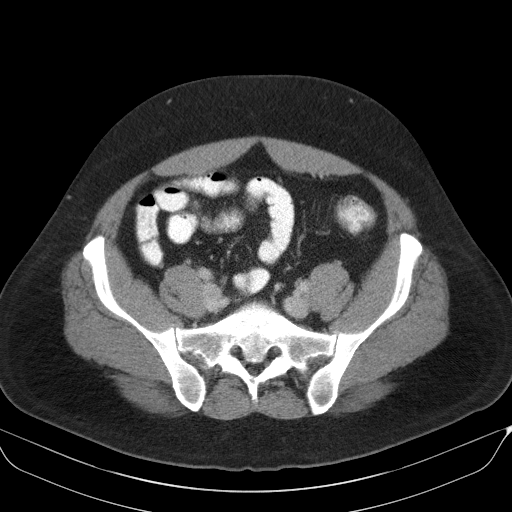
[im 42/99  soft-tissue]
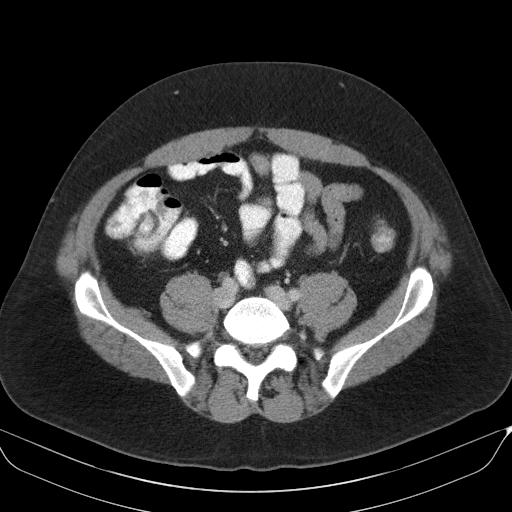
[im 52/99  soft-tissue]
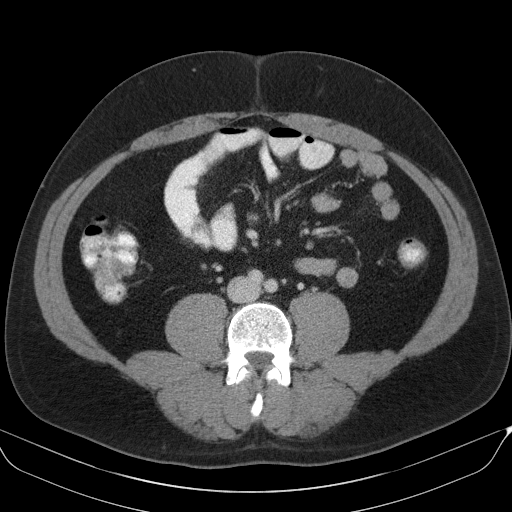
[im 57/99  soft-tissue]
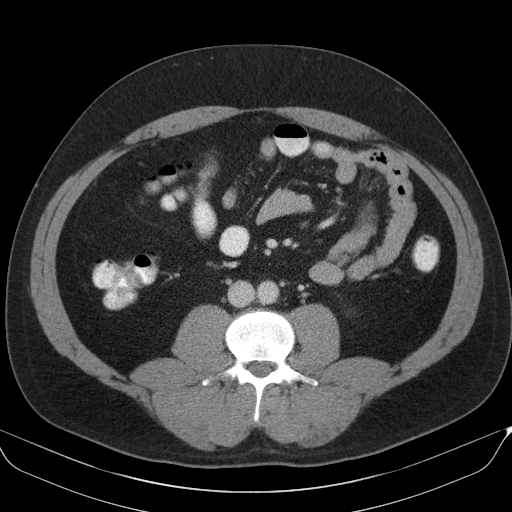
[im 62/99  soft-tissue]
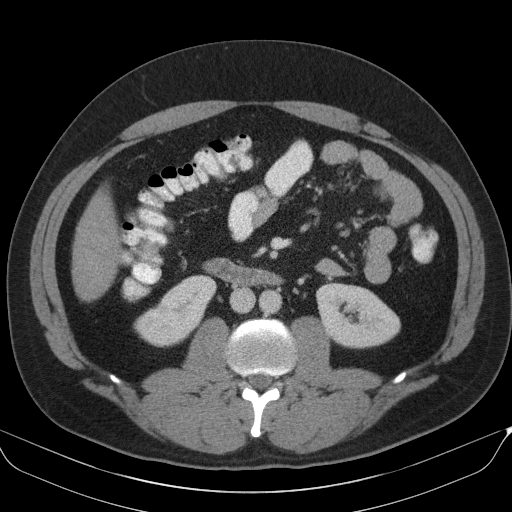
[im 62/99  bone]
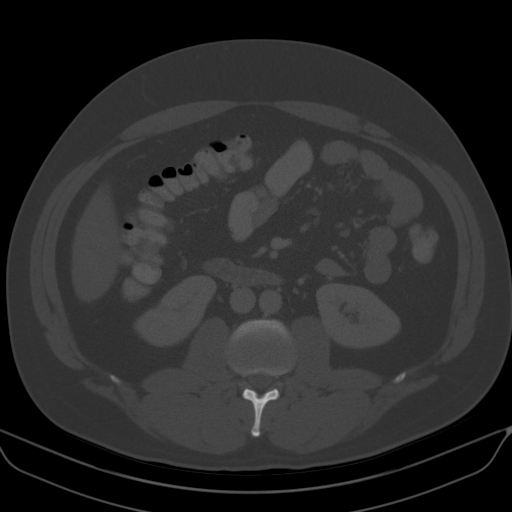
[im 73/99  soft-tissue]
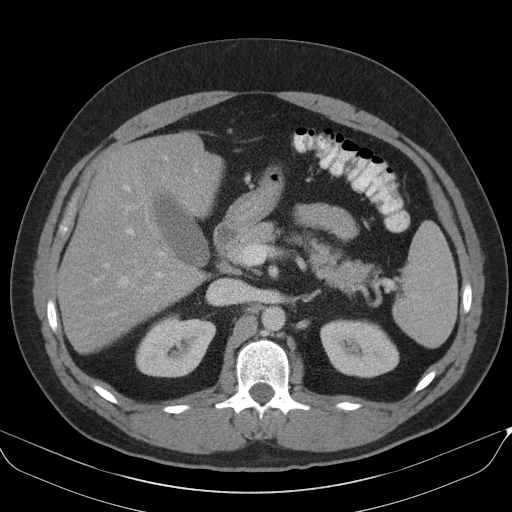
[im 78/99  soft-tissue]
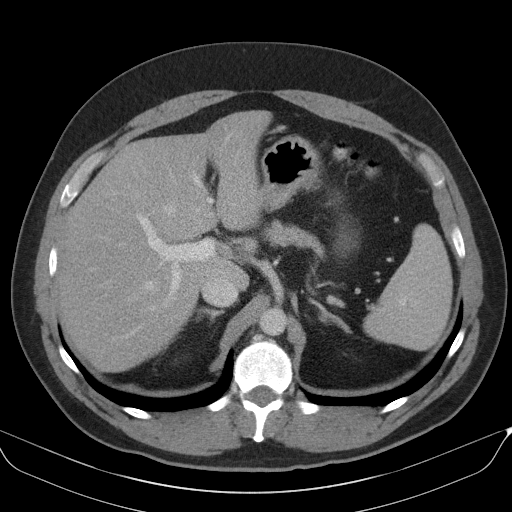
[im 83/99  soft-tissue]
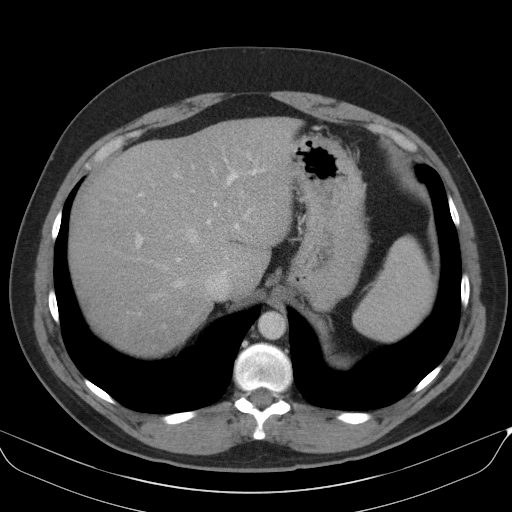
[im 93/99  soft-tissue]
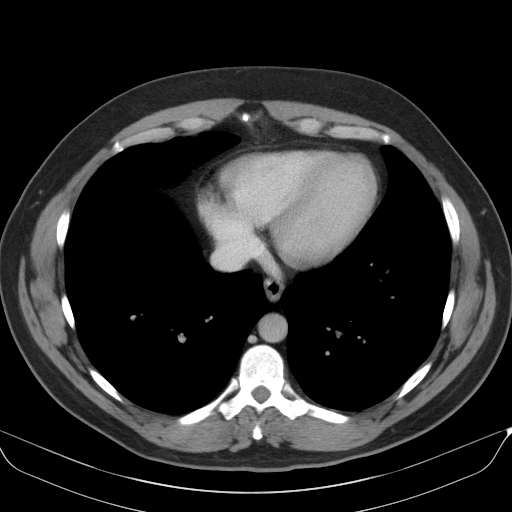

[Series 3: cor · coronal · 0.72mm/px · 3 of 110 slices shown]
[im 37/110  soft-tissue]
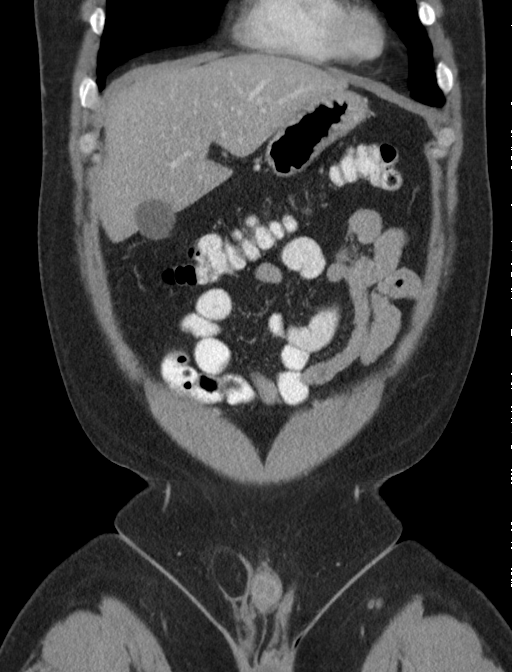
[im 49/110  soft-tissue]
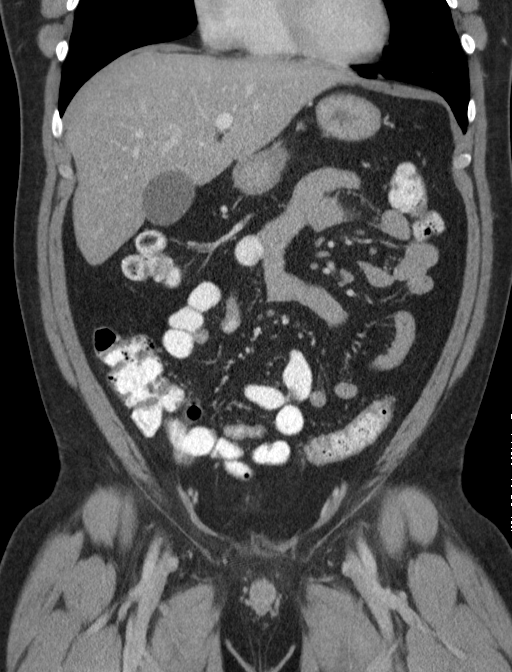
[im 61/110  soft-tissue]
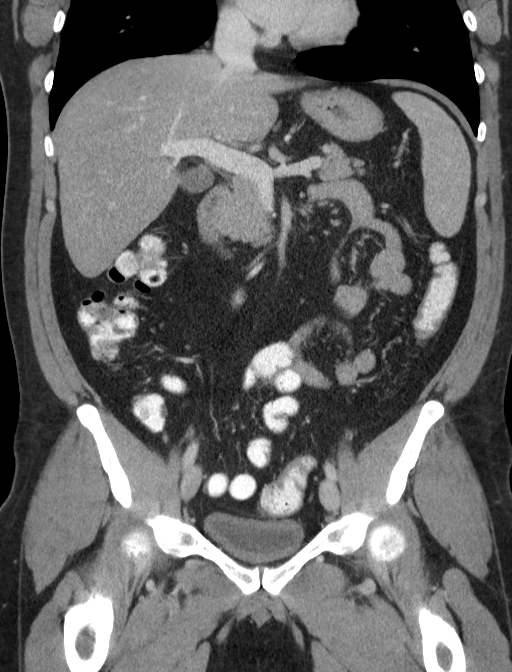

[16 of 46 positions shown; findings below may reference images not displayed]

FINDINGS: Lower chest: Normal heart size. Lung bases are clear. No pleural
effusion.

Hepatobiliary: Liver is normal in size and contour. No focal lesion
identified. Gallbladder is unremarkable.

Pancreas: Unremarkable

Spleen: Unremarkable

Adrenals/Urinary Tract: The adrenal glands are normal. Kidneys
enhance symmetrically with contrast. No hydronephrosis. Urinary
bladder is decompressed.

Stomach/Bowel: Oral contrast material to the level of the rectum.
Descending colon is mildly decompressed, limiting evaluation. The
appendix is normal. No abnormal bowel wall thickening or evidence
for bowel obstruction. Normal morphology of the stomach.

Vascular/Lymphatic: Normal caliber abdominal aorta. No
retroperitoneal lymphadenopathy.

Reproductive: Prostate is unremarkable.

Other: Bilateral fat containing inguinal hernias, right greater than
left.

Musculoskeletal: No aggressive or acute appearing osseous lesions.
IMPRESSION: No acute process within the abdomen or pelvis.

Bilateral fat containing inguinal hernias, right-greater-than-left.

## 2018-01-03 DIAGNOSIS — G4733 Obstructive sleep apnea (adult) (pediatric): Secondary | ICD-10-CM | POA: Diagnosis not present

## 2018-03-14 DIAGNOSIS — S61011A Laceration without foreign body of right thumb without damage to nail, initial encounter: Secondary | ICD-10-CM | POA: Diagnosis not present

## 2018-03-18 DIAGNOSIS — G4733 Obstructive sleep apnea (adult) (pediatric): Secondary | ICD-10-CM | POA: Diagnosis not present

## 2018-03-18 DIAGNOSIS — R5383 Other fatigue: Secondary | ICD-10-CM | POA: Diagnosis not present

## 2018-03-29 DIAGNOSIS — M9903 Segmental and somatic dysfunction of lumbar region: Secondary | ICD-10-CM | POA: Diagnosis not present

## 2018-03-29 DIAGNOSIS — M9901 Segmental and somatic dysfunction of cervical region: Secondary | ICD-10-CM | POA: Diagnosis not present

## 2018-03-29 DIAGNOSIS — M9902 Segmental and somatic dysfunction of thoracic region: Secondary | ICD-10-CM | POA: Diagnosis not present

## 2018-04-05 DIAGNOSIS — M19011 Primary osteoarthritis, right shoulder: Secondary | ICD-10-CM | POA: Diagnosis not present

## 2018-04-05 DIAGNOSIS — M7541 Impingement syndrome of right shoulder: Secondary | ICD-10-CM | POA: Diagnosis not present

## 2018-04-05 DIAGNOSIS — M25511 Pain in right shoulder: Secondary | ICD-10-CM | POA: Diagnosis not present

## 2018-05-06 DIAGNOSIS — Z0000 Encounter for general adult medical examination without abnormal findings: Secondary | ICD-10-CM | POA: Diagnosis not present

## 2018-05-06 DIAGNOSIS — G4733 Obstructive sleep apnea (adult) (pediatric): Secondary | ICD-10-CM | POA: Diagnosis not present

## 2018-05-06 DIAGNOSIS — Z0001 Encounter for general adult medical examination with abnormal findings: Secondary | ICD-10-CM | POA: Diagnosis not present

## 2018-05-11 DIAGNOSIS — M9901 Segmental and somatic dysfunction of cervical region: Secondary | ICD-10-CM | POA: Diagnosis not present

## 2018-05-11 DIAGNOSIS — M9902 Segmental and somatic dysfunction of thoracic region: Secondary | ICD-10-CM | POA: Diagnosis not present

## 2018-05-11 DIAGNOSIS — M9903 Segmental and somatic dysfunction of lumbar region: Secondary | ICD-10-CM | POA: Diagnosis not present

## 2018-06-23 DIAGNOSIS — R5383 Other fatigue: Secondary | ICD-10-CM | POA: Diagnosis not present

## 2018-06-23 DIAGNOSIS — G4733 Obstructive sleep apnea (adult) (pediatric): Secondary | ICD-10-CM | POA: Diagnosis not present

## 2018-08-12 DIAGNOSIS — I10 Essential (primary) hypertension: Secondary | ICD-10-CM | POA: Diagnosis not present

## 2018-08-12 DIAGNOSIS — J309 Allergic rhinitis, unspecified: Secondary | ICD-10-CM | POA: Diagnosis not present

## 2018-08-23 ENCOUNTER — Ambulatory Visit: Payer: Self-pay | Admitting: Allergy and Immunology

## 2019-01-06 IMAGING — US US ABDOMEN COMPLETE
1 series · 14 of 25 positions shown · non-contrast
Comparison: CT scan dated 05/28/2016

CLINICAL DATA: Abdominal pain.

EXAM:
ABDOMEN ULTRASOUND COMPLETE

[Series 1: us abdomen complete · 0.30mm/px · 14 of 79 slices shown]
[im 1/79]
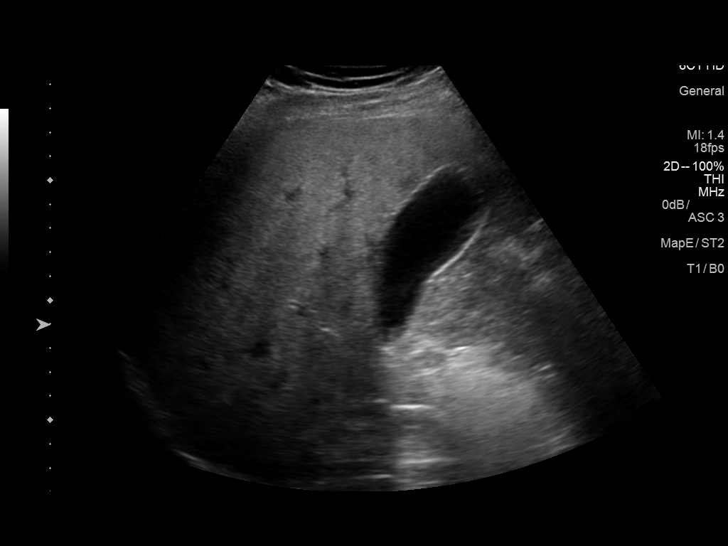
[im 7/79]
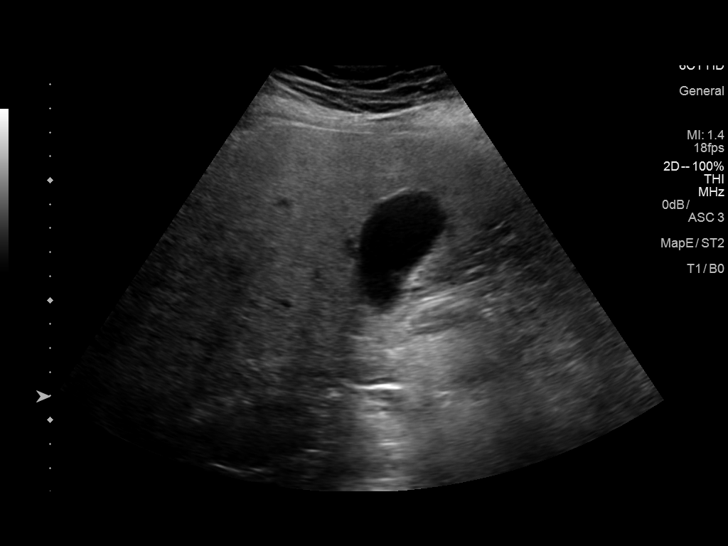
[im 14/79]
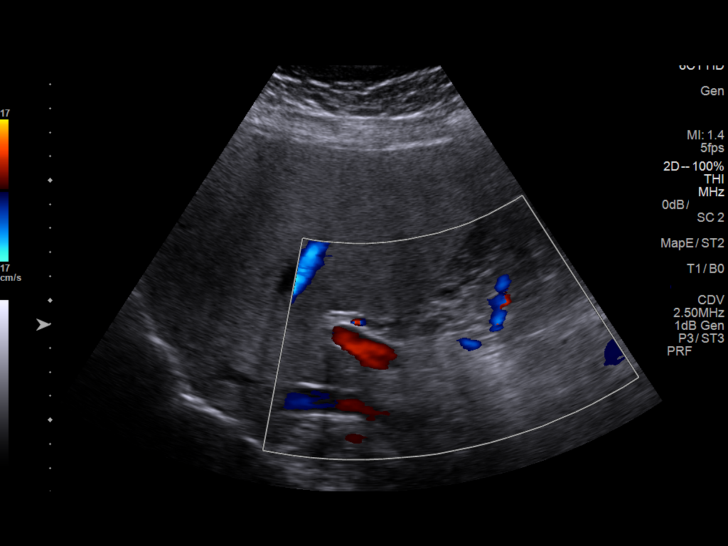
[im 20/79]
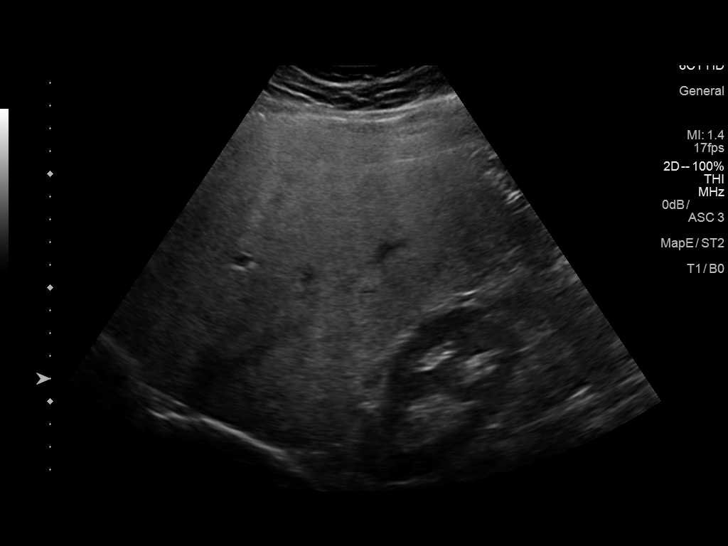
[im 27/79]
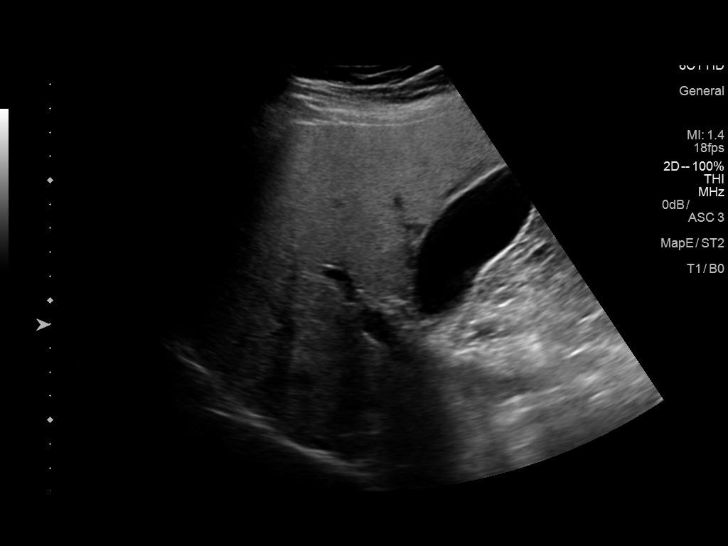
[im 30/79]
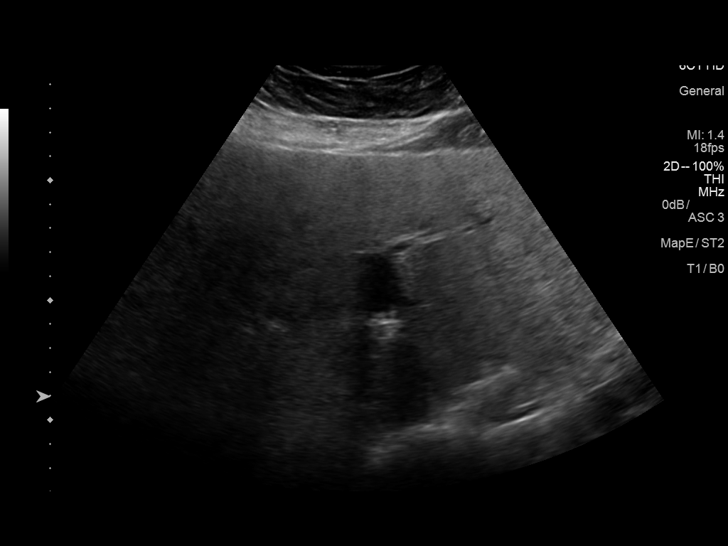
[im 36/79]
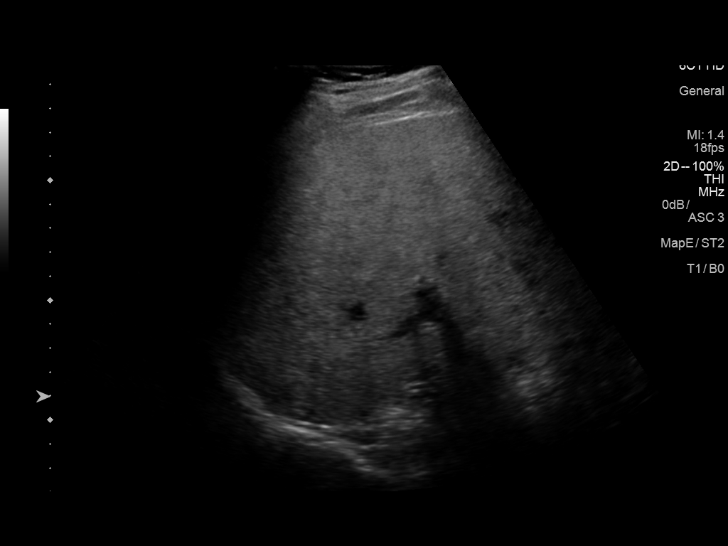
[im 43/79]
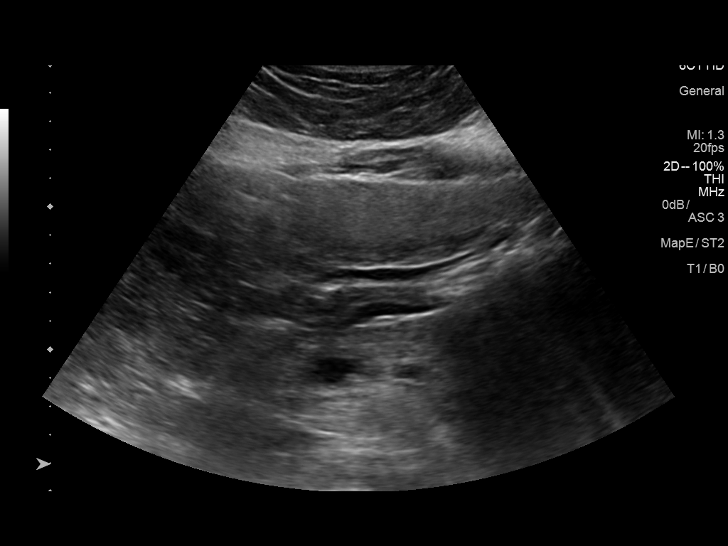
[im 49/79]
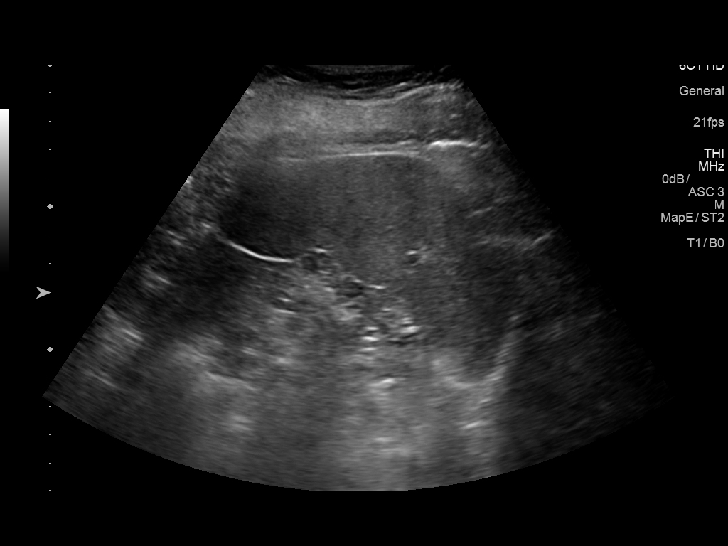
[im 53/79]
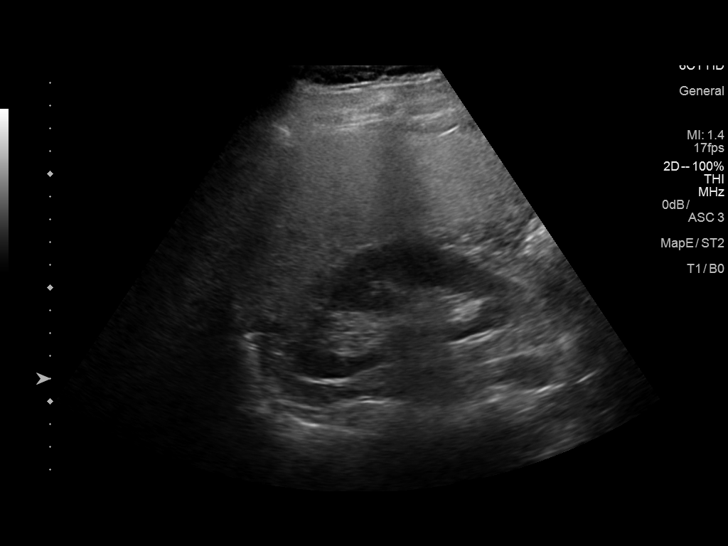
[im 59/79]
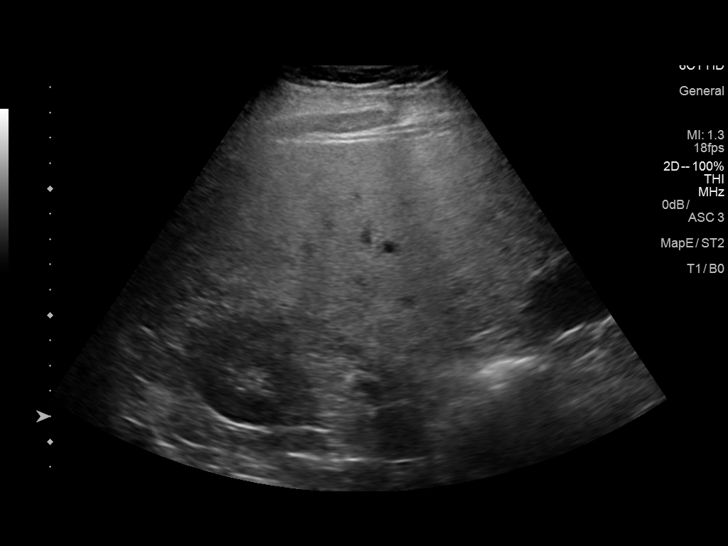
[im 66/79]
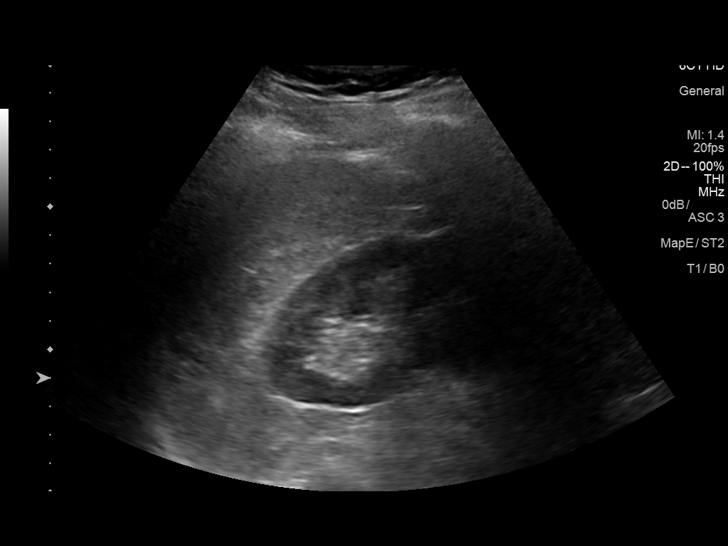
[im 72/79]
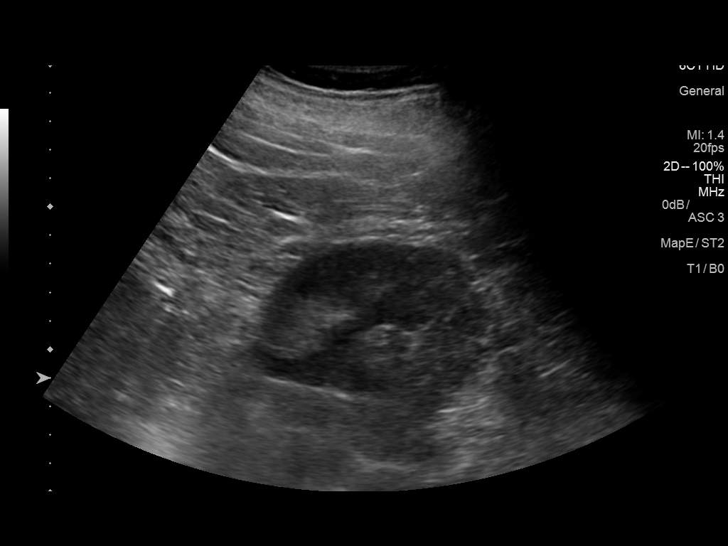
[im 79/79]
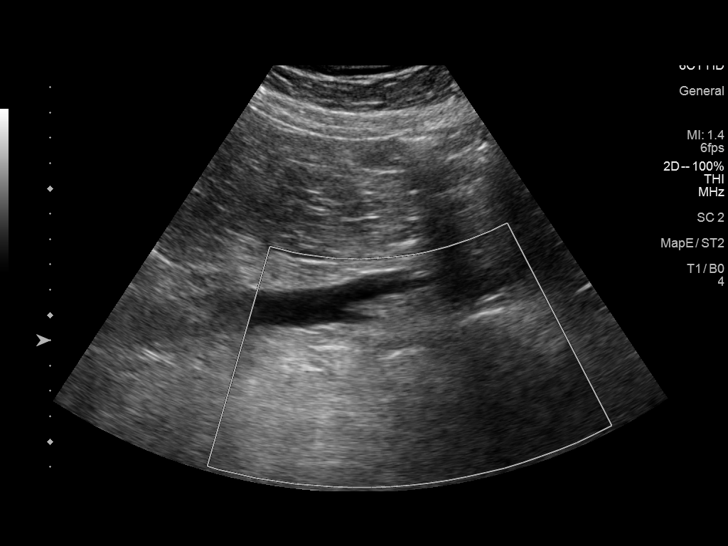

[14 of 25 positions shown; findings below may reference images not displayed]

FINDINGS: Gallbladder: No gallstones or wall thickening visualized. No
sonographic Murphy sign noted by sonographer.

Common bile duct: Diameter: 3.9 mm

Liver: Slight increased echogenicity of the liver parenchyma
consistent in hepatic steatosis. Portal vein is patent on color
Doppler imaging with normal direction of blood flow towards the
liver.

IVC: No abnormality visualized.

Pancreas: Visualized portion unremarkable.

Spleen: Size and appearance within normal limits.

Right Kidney: Length: 11.2 cm. Echogenicity within normal limits. No
mass or hydronephrosis visualized.

Left Kidney: Length: 11.5 cm. Echogenicity within normal limits. No
mass or hydronephrosis visualized.

Abdominal aorta: No aneurysm visualized.

Other findings: None.
IMPRESSION: Increased echogenicity of the liver parenchyma consistent with
hepatic steatosis. No focal lesions. Otherwise, normal exam.

## 2019-07-28 ENCOUNTER — Ambulatory Visit: Payer: Self-pay | Attending: Internal Medicine

## 2019-07-28 DIAGNOSIS — Z23 Encounter for immunization: Secondary | ICD-10-CM

## 2019-07-28 NOTE — Progress Notes (Signed)
   Covid-19 Vaccination Clinic  Name:  Scott Bowman    MRN: [id number] DOB: 1975-07-24  07/28/2019  Mr. [name] was observed post Covid-19 immunization for 15 minutes without incident. He was provided with Vaccine Information Sheet and instruction to access the V-Safe system.   Mr. [name] was instructed to call 911 with any severe reactions post vaccine: Marland Kitchen Difficulty breathing  . Swelling of face and throat  . A fast heartbeat  . A bad rash all over body  . Dizziness and weakness   Immunizations Administered    Name Date Dose VIS Date Route   Pfizer COVID-19 Vaccine 07/28/2019  3:37 PM 0.3 mL 04/07/2019 Intramuscular   Manufacturer: [employer], [employer]   Lot: [id number]   NDC: [address]-3

## 2019-08-21 ENCOUNTER — Ambulatory Visit: Payer: Self-pay | Attending: Internal Medicine

## 2019-08-21 DIAGNOSIS — Z23 Encounter for immunization: Secondary | ICD-10-CM

## 2019-08-21 NOTE — Progress Notes (Signed)
   Covid-19 Vaccination Clinic  Name:  Scott Bowman    MRN: [id number] DOB: 04-14-1976  08/21/2019  Scott Bowman was observed post Covid-19 immunization for 15 minutes without incident. He was provided with Vaccine Information Sheet and instruction to access the V-Safe system.   Scott Bowman was instructed to call 911 with any severe reactions post vaccine: Marland Kitchen Difficulty breathing  . Swelling of face and throat  . A fast heartbeat  . A bad rash all over body  . Dizziness and weakness   Immunizations Administered    Name Date Dose VIS Date Route   Pfizer COVID-19 Vaccine 08/21/2019  3:01 PM 0.3 mL 06/21/2018 Intramuscular   Manufacturer: [employer], [employer]   Lot: [id number]   NDC: [address]-9

## 2021-01-18 ENCOUNTER — Emergency Department (HOSPITAL_BASED_OUTPATIENT_CLINIC_OR_DEPARTMENT_OTHER): Payer: 59

## 2021-01-18 ENCOUNTER — Other Ambulatory Visit: Payer: Self-pay

## 2021-01-18 ENCOUNTER — Encounter (HOSPITAL_BASED_OUTPATIENT_CLINIC_OR_DEPARTMENT_OTHER): Payer: Self-pay | Admitting: Emergency Medicine

## 2021-01-18 ENCOUNTER — Emergency Department (HOSPITAL_BASED_OUTPATIENT_CLINIC_OR_DEPARTMENT_OTHER)
Admission: EM | Admit: 2021-01-18 | Discharge: 2021-01-18 | Disposition: A | Discharge status: Home / Self Care | Payer: 59 | Attending: Emergency Medicine | Admitting: Emergency Medicine

## 2021-01-18 DIAGNOSIS — W458XXA Other foreign body or object entering through skin, initial encounter: Secondary | ICD-10-CM | POA: Insufficient documentation

## 2021-01-18 DIAGNOSIS — Z79899 Other long term (current) drug therapy: Secondary | ICD-10-CM | POA: Insufficient documentation

## 2021-01-18 DIAGNOSIS — I10 Essential (primary) hypertension: Secondary | ICD-10-CM | POA: Insufficient documentation

## 2021-01-18 DIAGNOSIS — S91332A Puncture wound without foreign body, left foot, initial encounter: Secondary | ICD-10-CM | POA: Diagnosis not present

## 2021-01-18 DIAGNOSIS — S99922A Unspecified injury of left foot, initial encounter: Secondary | ICD-10-CM | POA: Diagnosis present

## 2021-01-18 MED ORDER — CEPHALEXIN 500 MG PO CAPS: 500.0000 mg | ORAL_CAPSULE | Freq: Four times a day (QID) | ORAL | 0 refills | Status: AC

## 2021-01-18 NOTE — ED Provider Notes (Signed)
[facility] EMERGENCY DEPT Provider Note   CSN: [id number] Arrival date & time: 01/18/21  1761     History Chief Complaint  Patient presents with   Foot Pain    Scott Bowman is a 45 y.o. male presented emergency department with left foot pain.  The patient reports that he stepped on a piece of mulch yesterday and feels that punctured his left heel.  He reports significant tenderness and pain with stepping on his left foot today.  He denies fevers or chills.  He is allergic to amoxicillin.  HPI     Past Medical History:  Diagnosis Date   Depression     There are no problems to display for this patient.   Past Surgical History:  Procedure Laterality Date   NASAL SINUS SURGERY         No family history on file.  Social History   Tobacco Use   Smoking status: Never    Passive exposure: Past   Smokeless tobacco: Never  Vaping Use   Vaping Use: Never used  Substance Use Topics   Alcohol use: Yes    Comment: couple of beers a day   Drug use: No    Home Medications Prior to Admission medications   Medication Sig Start Date End Date Taking? Authorizing Provider  cephALEXin (KEFLEX) 500 MG capsule Take 1 capsule (500 mg total) by mouth 4 (four) times daily for 5 days. 01/18/21 01/23/21 Yes Daveon Arpino, Kermit Balo, MD  esomeprazole (NEXIUM) 20 MG capsule Take 40 mg by mouth at bedtime.    Yes [provider]  FLUoxetine (PROZAC) 20 MG tablet Take 30 mg by mouth daily.   Yes [provider]  gemfibrozil (LOPID) 600 MG tablet Take 600 mg by mouth 2 (two) times daily before a meal.   Yes [provider]  albuterol (PROVENTIL HFA;VENTOLIN HFA) 108 (90 BASE) MCG/ACT inhaler Inhale 1-2 puffs into the lungs every 6 (six) hours as needed for wheezing or shortness of breath.    [provider]  buPROPion (WELLBUTRIN XL) 150 MG 24 hr tablet Take 300 mg by mouth every morning.    [provider]  clonazePAM (KLONOPIN) 0.5 MG  tablet Take 0.5 mg by mouth at bedtime.    [provider]  Multiple Vitamin (MULTIVITAMIN WITH MINERALS) TABS tablet Take 1 tablet by mouth daily.    [provider]    Allergies    Amoxicillin  Review of Systems   Review of Systems  Constitutional:  Negative for chills and fever.  Musculoskeletal:  Positive for gait problem and myalgias. Negative for arthralgias.  Skin:  Positive for wound. Negative for rash.  Neurological:  Negative for weakness and numbness.   Physical Exam Updated Vital Signs BP (!) 165/109   Pulse 83   Temp 98 F (36.7 C) (Oral)   Resp 18   Ht 5\' 9"  (1.753 m)   Wt 104.3 kg   SpO2 98%   BMI 33.97 kg/m   Physical Exam Constitutional:      General: He is not in acute distress. HENT:     Head: Normocephalic and atraumatic.  Cardiovascular:     Rate and Rhythm: Normal rate and regular rhythm.  Skin:    General: Skin is warm and dry.     Comments: Punctate wound to left calcaneus, no surrounding erythema, induration, or edema  Neurological:     Mental Status: He is alert.  Psychiatric:  Mood and Affect: Mood normal.        Behavior: Behavior normal.    ED Results / Procedures / Treatments   Labs (all labs ordered are listed, but only abnormal results are displayed) Labs Reviewed - No data to display  EKG None  Radiology DG Os Calcis Left  Result Date: 01/18/2021 CLINICAL DATA:  Stepped on object yesterday now with heel tenderness and puncture wound with surrounding redness. EXAM: LEFT OS CALCIS - 2+ VIEW COMPARISON:  None. FINDINGS: No acute osseous abnormality. No fracture or dislocation. No radiopaque foreign bodies identified. Soft tissues appear edematous. IMPRESSION: Soft tissue swelling. Electronically Signed   By: Signa Kell M.D.   On: 01/18/2021 09:27    Procedures Procedures   Medications Ordered in ED Medications - No data to display  ED Course  I have reviewed the triage vital signs and the nursing  notes.  Pertinent labs & imaging results that were available during my care of the patient were reviewed by me and considered in my medical decision making (see chart for details).  Small puncture wound of left heel, likely organic foreign body (mulch) - no evidence of infection at this time, but given that this is organic material and dirt, reasonable to cover for 5 days with keflex empirically as ppx.  Would advise podiatry follow up if not improved.  DG films reviewed and interpreted for heel injury - no visible foreign body   Final Clinical Impression(s) / ED Diagnoses Final diagnoses:  Puncture wound of left heel, initial encounter  Hypertension, unspecified type    Rx / DC Orders ED Discharge Orders          Ordered    cephALEXin (KEFLEX) 500 MG capsule  4 times daily        01/18/21 0924             Terald Sleeper, MD 01/18/21 1209

## 2021-01-18 NOTE — Discharge Instructions (Addendum)
We started you on Keflex an antibiotic to prevent infection of your foot.  Please take the full course as prescribed.  You can use crutches or try to keep weight off the foot.  If you still cannot bear normal weight in 1 week, or feel the pain is worsening, schedule a follow up appointment with the [facility] (podiatry).  You can take tylenol and motrin (ibuprofen) over the counter as needed for pain.  *  Your blood pressure was also quite high today.  Please follow up with your primary care provider for this issue, and to have your pressure rechecked.  If your pressure remains high, your PCP may want to start you on blood pressure medications.

## 2021-01-18 NOTE — ED Notes (Signed)
Pt dc home. Voiced understanding of dc instrucitons.

## 2021-01-18 NOTE — ED Triage Notes (Signed)
Pt stepped on something last night,he thinks maybe mulch. His left heel is tender, has obvious puncture and red around puncture wound. Pt unable to retrieve anything from it.

## 2021-01-22 ENCOUNTER — Ambulatory Visit (INDEPENDENT_AMBULATORY_CARE_PROVIDER_SITE_OTHER): Payer: 59 | Admitting: Podiatry

## 2021-01-22 ENCOUNTER — Other Ambulatory Visit: Payer: Self-pay

## 2021-01-22 DIAGNOSIS — M79672 Pain in left foot: Secondary | ICD-10-CM

## 2021-01-22 DIAGNOSIS — S90852A Superficial foreign body, left foot, initial encounter: Secondary | ICD-10-CM

## 2021-01-22 NOTE — Patient Instructions (Signed)

## 2021-01-22 NOTE — Progress Notes (Signed)
Subjective:   Patient ID: Scott Bowman, male   DOB: 45 y.o.   MRN: [id number]   HPI 45 year old male presents the office with concerns of a foreign body, mulch on the bottom of his left heel which occurred on Friday.  States he was outside not wearing shoes then a piece of mulch was in his foot.  He went to the emergency room and x-rays and ultrasound were performed.  He was given a prescription for appears to be cephalexin has not yet started this.  He has tenderness localized to the area but no other area discomfort.  No drainage.  No other treatment.  No other concerns   Review of Systems  All other systems reviewed and are negative.  Past Medical History:  Diagnosis Date   Depression     Past Surgical History:  Procedure Laterality Date   NASAL SINUS SURGERY       Current Outpatient Medications:    albuterol (PROVENTIL HFA;VENTOLIN HFA) 108 (90 BASE) MCG/ACT inhaler, Inhale 1-2 puffs into the lungs every 6 (six) hours as needed for wheezing or shortness of breath., Disp: , Rfl:    buPROPion (WELLBUTRIN XL) 150 MG 24 hr tablet, Take 300 mg by mouth every morning., Disp: , Rfl:    cephALEXin (KEFLEX) 500 MG capsule, Take 1 capsule (500 mg total) by mouth 4 (four) times daily for 5 days., Disp: 20 capsule, Rfl: 0   clonazePAM (KLONOPIN) 0.5 MG tablet, Take 0.5 mg by mouth at bedtime., Disp: , Rfl:    esomeprazole (NEXIUM) 20 MG capsule, Take 40 mg by mouth at bedtime. , Disp: , Rfl:    FLUoxetine (PROZAC) 20 MG tablet, Take 30 mg by mouth daily., Disp: , Rfl:    gemfibrozil (LOPID) 600 MG tablet, Take 600 mg by mouth 2 (two) times daily before a meal., Disp: , Rfl:    Multiple Vitamin (MULTIVITAMIN WITH MINERALS) TABS tablet, Take 1 tablet by mouth daily., Disp: , Rfl:   Allergies  Allergen Reactions   Amoxicillin Rash          Objective:  Physical Exam  General: AAO x3, NAD  Dermatological: On the plantar aspect of the left heel small hyperkeratotic lesion with  evidence of foreign body underneath.  Localized edema but there is no drainage or pus or ascending cellulitis.  No other puncture wounds or open sores noted.  Vascular: Dorsalis Pedis artery and Posterior Tibial artery pedal pulses are 2/4 bilateral with immedate capillary fill time.  There is no pain with calf compression, swelling, warmth, erythema.   Neruologic: Grossly intact via light touch bilateral.  Musculoskeletal: Tenderness to the area of the foreign body.  Muscular strength 5/5 in all groups tested bilateral.  Gait: Unassisted, Nonantalgic.       Assessment:   Foreign body left heel     Plan:  -Treatment options discussed including all alternatives, risks, and complications -Etiology of symptoms were discussed -Independently reviewed the x-rays in the emergency department.  I discussed with him excision of the foreign object in the office today and he wants to proceed with this.  Skin was prepped with alcohol and 3 cc lidocaine, Marcaine plain was infiltrated in a regional block fashion.  Once anesthetized I used an additional 1 cc of lidocaine with epinephrine underneath the lesion.  The skin was prepped with Betadine, alcohol.  I then utilized a 15 blade scalpel to debride the hyperkeratotic tissue and has easily able to identify the foreign body which was  removed.  I removed a 1 cm piece of what appeared to be pine bark.  No further foreign body identified.  I irrigated the wound with alcohol, saline.  Antibiotic ointment and dressing applied.  He tolerated the procedure well and complications.  Recommend to start the cephalexin.  Soaking instructions discussed.  Vivi Barrack DPM

## 2021-01-27 ENCOUNTER — Ambulatory Visit: Payer: BLUE CROSS/BLUE SHIELD | Admitting: Podiatry

## 2021-01-31 ENCOUNTER — Ambulatory Visit: Payer: 59 | Admitting: Podiatry

## 2021-04-10 DIAGNOSIS — F411 Generalized anxiety disorder: Secondary | ICD-10-CM | POA: Diagnosis not present

## 2021-04-10 DIAGNOSIS — F331 Major depressive disorder, recurrent, moderate: Secondary | ICD-10-CM | POA: Diagnosis not present

## 2021-05-29 DIAGNOSIS — M9907 Segmental and somatic dysfunction of upper extremity: Secondary | ICD-10-CM | POA: Diagnosis not present

## 2021-05-29 DIAGNOSIS — M9901 Segmental and somatic dysfunction of cervical region: Secondary | ICD-10-CM | POA: Diagnosis not present

## 2021-05-29 DIAGNOSIS — M9903 Segmental and somatic dysfunction of lumbar region: Secondary | ICD-10-CM | POA: Diagnosis not present

## 2021-05-29 DIAGNOSIS — M9902 Segmental and somatic dysfunction of thoracic region: Secondary | ICD-10-CM | POA: Diagnosis not present

## 2021-06-17 DIAGNOSIS — G4733 Obstructive sleep apnea (adult) (pediatric): Secondary | ICD-10-CM | POA: Diagnosis not present

## 2021-06-26 DIAGNOSIS — M9902 Segmental and somatic dysfunction of thoracic region: Secondary | ICD-10-CM | POA: Diagnosis not present

## 2021-06-26 DIAGNOSIS — M9907 Segmental and somatic dysfunction of upper extremity: Secondary | ICD-10-CM | POA: Diagnosis not present

## 2021-06-26 DIAGNOSIS — M9901 Segmental and somatic dysfunction of cervical region: Secondary | ICD-10-CM | POA: Diagnosis not present

## 2021-06-26 DIAGNOSIS — M9903 Segmental and somatic dysfunction of lumbar region: Secondary | ICD-10-CM | POA: Diagnosis not present

## 2021-07-15 DIAGNOSIS — G4733 Obstructive sleep apnea (adult) (pediatric): Secondary | ICD-10-CM | POA: Diagnosis not present

## 2021-08-01 DIAGNOSIS — M9907 Segmental and somatic dysfunction of upper extremity: Secondary | ICD-10-CM | POA: Diagnosis not present

## 2021-08-01 DIAGNOSIS — M9902 Segmental and somatic dysfunction of thoracic region: Secondary | ICD-10-CM | POA: Diagnosis not present

## 2021-08-01 DIAGNOSIS — M9903 Segmental and somatic dysfunction of lumbar region: Secondary | ICD-10-CM | POA: Diagnosis not present

## 2021-08-01 DIAGNOSIS — M9901 Segmental and somatic dysfunction of cervical region: Secondary | ICD-10-CM | POA: Diagnosis not present

## 2021-08-15 DIAGNOSIS — G4733 Obstructive sleep apnea (adult) (pediatric): Secondary | ICD-10-CM | POA: Diagnosis not present

## 2021-08-27 DIAGNOSIS — M9901 Segmental and somatic dysfunction of cervical region: Secondary | ICD-10-CM | POA: Diagnosis not present

## 2021-08-27 DIAGNOSIS — M9902 Segmental and somatic dysfunction of thoracic region: Secondary | ICD-10-CM | POA: Diagnosis not present

## 2021-08-27 DIAGNOSIS — M9903 Segmental and somatic dysfunction of lumbar region: Secondary | ICD-10-CM | POA: Diagnosis not present

## 2021-09-15 DIAGNOSIS — G4733 Obstructive sleep apnea (adult) (pediatric): Secondary | ICD-10-CM | POA: Diagnosis not present

## 2021-09-24 DIAGNOSIS — M9902 Segmental and somatic dysfunction of thoracic region: Secondary | ICD-10-CM | POA: Diagnosis not present

## 2021-09-24 DIAGNOSIS — M9903 Segmental and somatic dysfunction of lumbar region: Secondary | ICD-10-CM | POA: Diagnosis not present

## 2021-09-24 DIAGNOSIS — M9901 Segmental and somatic dysfunction of cervical region: Secondary | ICD-10-CM | POA: Diagnosis not present

## 2021-10-09 DIAGNOSIS — F411 Generalized anxiety disorder: Secondary | ICD-10-CM | POA: Diagnosis not present

## 2021-10-09 DIAGNOSIS — F331 Major depressive disorder, recurrent, moderate: Secondary | ICD-10-CM | POA: Diagnosis not present

## 2021-10-16 DIAGNOSIS — G4733 Obstructive sleep apnea (adult) (pediatric): Secondary | ICD-10-CM | POA: Diagnosis not present

## 2021-11-15 DIAGNOSIS — G4733 Obstructive sleep apnea (adult) (pediatric): Secondary | ICD-10-CM | POA: Diagnosis not present

## 2021-11-18 DIAGNOSIS — M9901 Segmental and somatic dysfunction of cervical region: Secondary | ICD-10-CM | POA: Diagnosis not present

## 2021-11-18 DIAGNOSIS — M9903 Segmental and somatic dysfunction of lumbar region: Secondary | ICD-10-CM | POA: Diagnosis not present

## 2021-11-18 DIAGNOSIS — M9902 Segmental and somatic dysfunction of thoracic region: Secondary | ICD-10-CM | POA: Diagnosis not present

## 2021-11-22 DIAGNOSIS — S60041A Contusion of right ring finger without damage to nail, initial encounter: Secondary | ICD-10-CM | POA: Diagnosis not present

## 2021-11-22 DIAGNOSIS — M79644 Pain in right finger(s): Secondary | ICD-10-CM | POA: Diagnosis not present

## 2021-12-15 DIAGNOSIS — G4733 Obstructive sleep apnea (adult) (pediatric): Secondary | ICD-10-CM | POA: Diagnosis not present

## 2021-12-16 DIAGNOSIS — M9903 Segmental and somatic dysfunction of lumbar region: Secondary | ICD-10-CM | POA: Diagnosis not present

## 2021-12-16 DIAGNOSIS — M9902 Segmental and somatic dysfunction of thoracic region: Secondary | ICD-10-CM | POA: Diagnosis not present

## 2021-12-16 DIAGNOSIS — M9901 Segmental and somatic dysfunction of cervical region: Secondary | ICD-10-CM | POA: Diagnosis not present

## 2021-12-22 DIAGNOSIS — G4733 Obstructive sleep apnea (adult) (pediatric): Secondary | ICD-10-CM | POA: Diagnosis not present

## 2021-12-31 DIAGNOSIS — G4733 Obstructive sleep apnea (adult) (pediatric): Secondary | ICD-10-CM | POA: Diagnosis not present

## 2022-01-09 DIAGNOSIS — I10 Essential (primary) hypertension: Secondary | ICD-10-CM | POA: Diagnosis not present

## 2022-01-09 DIAGNOSIS — E785 Hyperlipidemia, unspecified: Secondary | ICD-10-CM | POA: Diagnosis not present

## 2022-01-09 DIAGNOSIS — Z0000 Encounter for general adult medical examination without abnormal findings: Secondary | ICD-10-CM | POA: Diagnosis not present

## 2022-01-09 DIAGNOSIS — Z23 Encounter for immunization: Secondary | ICD-10-CM | POA: Diagnosis not present

## 2022-01-09 DIAGNOSIS — G4733 Obstructive sleep apnea (adult) (pediatric): Secondary | ICD-10-CM | POA: Diagnosis not present

## 2022-01-09 DIAGNOSIS — F411 Generalized anxiety disorder: Secondary | ICD-10-CM | POA: Diagnosis not present

## 2022-01-09 DIAGNOSIS — K219 Gastro-esophageal reflux disease without esophagitis: Secondary | ICD-10-CM | POA: Diagnosis not present

## 2022-01-13 DIAGNOSIS — M9901 Segmental and somatic dysfunction of cervical region: Secondary | ICD-10-CM | POA: Diagnosis not present

## 2022-01-13 DIAGNOSIS — M9903 Segmental and somatic dysfunction of lumbar region: Secondary | ICD-10-CM | POA: Diagnosis not present

## 2022-01-13 DIAGNOSIS — M9902 Segmental and somatic dysfunction of thoracic region: Secondary | ICD-10-CM | POA: Diagnosis not present

## 2022-01-15 DIAGNOSIS — G4733 Obstructive sleep apnea (adult) (pediatric): Secondary | ICD-10-CM | POA: Diagnosis not present

## 2022-01-22 DIAGNOSIS — G4733 Obstructive sleep apnea (adult) (pediatric): Secondary | ICD-10-CM | POA: Diagnosis not present

## 2022-02-09 DIAGNOSIS — E785 Hyperlipidemia, unspecified: Secondary | ICD-10-CM | POA: Diagnosis not present

## 2022-02-10 DIAGNOSIS — M9903 Segmental and somatic dysfunction of lumbar region: Secondary | ICD-10-CM | POA: Diagnosis not present

## 2022-02-10 DIAGNOSIS — M9902 Segmental and somatic dysfunction of thoracic region: Secondary | ICD-10-CM | POA: Diagnosis not present

## 2022-02-10 DIAGNOSIS — M9901 Segmental and somatic dysfunction of cervical region: Secondary | ICD-10-CM | POA: Diagnosis not present

## 2022-02-14 DIAGNOSIS — G4733 Obstructive sleep apnea (adult) (pediatric): Secondary | ICD-10-CM | POA: Diagnosis not present

## 2022-02-21 DIAGNOSIS — G4733 Obstructive sleep apnea (adult) (pediatric): Secondary | ICD-10-CM | POA: Diagnosis not present

## 2022-03-10 DIAGNOSIS — M9901 Segmental and somatic dysfunction of cervical region: Secondary | ICD-10-CM | POA: Diagnosis not present

## 2022-03-10 DIAGNOSIS — M9903 Segmental and somatic dysfunction of lumbar region: Secondary | ICD-10-CM | POA: Diagnosis not present

## 2022-03-10 DIAGNOSIS — M9902 Segmental and somatic dysfunction of thoracic region: Secondary | ICD-10-CM | POA: Diagnosis not present

## 2022-03-18 DIAGNOSIS — R002 Palpitations: Secondary | ICD-10-CM | POA: Diagnosis not present

## 2022-03-18 DIAGNOSIS — F339 Major depressive disorder, recurrent, unspecified: Secondary | ICD-10-CM | POA: Diagnosis not present

## 2022-03-18 DIAGNOSIS — E781 Pure hyperglyceridemia: Secondary | ICD-10-CM | POA: Diagnosis not present

## 2022-03-18 DIAGNOSIS — I10 Essential (primary) hypertension: Secondary | ICD-10-CM | POA: Diagnosis not present

## 2022-03-18 DIAGNOSIS — D696 Thrombocytopenia, unspecified: Secondary | ICD-10-CM | POA: Diagnosis not present

## 2022-03-24 DIAGNOSIS — G4733 Obstructive sleep apnea (adult) (pediatric): Secondary | ICD-10-CM | POA: Diagnosis not present

## 2022-04-07 DIAGNOSIS — M9903 Segmental and somatic dysfunction of lumbar region: Secondary | ICD-10-CM | POA: Diagnosis not present

## 2022-04-07 DIAGNOSIS — M9902 Segmental and somatic dysfunction of thoracic region: Secondary | ICD-10-CM | POA: Diagnosis not present

## 2022-04-07 DIAGNOSIS — M9901 Segmental and somatic dysfunction of cervical region: Secondary | ICD-10-CM | POA: Diagnosis not present

## 2022-04-07 NOTE — Progress Notes (Signed)
Cardiology Office Note:    Date:  04/08/2022   ID:  Scott Bowman, DOB 1975-08-17, MRN [id number]  PCP:  Leeroy Cha, MD  Cardiologist:  None   Referring MD: Caren Macadam, MD   Chief Complaint  Patient presents with   Irregular Heart Beat    History of Present Illness:    Scott Bowman is a 46 y.o. male with a hx of referred for evaluation of palpitations.  Other medical problems include ETOH dependence, obesity, primary hypertension, hyperlipidemia, OSA, and Asthma.  He has noted episodes of heart racing can last anywhere from 30 seconds to a minute.  He has been going on intermittently for greater than a year.  He does not have chest pain, dizziness, shortness of breath or other complaints when it occurs.  Otherwise he feels fine.  His mother has atrial fibrillation.  He has sleep apnea wears CPAP.  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   chlorthalidone (HYGROTON) 25 MG tablet Take 25 mg by mouth every morning.   esomeprazole (NEXIUM) 10 MG packet Take 10 mg by mouth daily before breakfast.   FLUoxetine (PROZAC) 20 MG tablet Take 20 mg by mouth daily.   Multiple Vitamin (MULTIVITAMIN WITH MINERALS) TABS tablet Take 1 tablet by mouth daily.   VASCEPA 1 g capsule Take 4 g by mouth daily. Pt takes 2 caps  8 g in the morning, 2 caps 8 g in the evening.     Allergies:   Amoxicillin   Social History   Socioeconomic History   Marital status: Married    Spouse name: Not on file   Number of children: Not on file   Years of education: Not on file   Highest education level: Not on file  Occupational History   Not on file  Tobacco Use   Smoking status: Never    Passive exposure: Past   Smokeless tobacco: Never  Vaping Use   Vaping Use: Never used  Substance and Sexual Activity   Alcohol use: Yes    Comment: couple of beers a day   Drug use: No   Sexual activity: Not on file  Other Topics Concern   Not on file  Social History Narrative   Not on file   Social Determinants of Health   Financial Resource Strain: Not on file  Food Insecurity: Not on file  Transportation Needs: Not on file  Physical Activity: Not on file  Stress: Not on file  Social Connections: Not on file     Family History: The patient's family history is not on file.  ROS:   Please see the history of present illness.    Snores.  Knows he has sleep apnea.  Gets occasional warning from his watch that his heart rate increases.  He does not pay much attention to it.  Unable to print an EKG.  All other systems reviewed and are negative.  EKGs/Labs/Other Studies Reviewed:    The following studies were reviewed today: No cardiac imaging  EKG:  EKG normal sinus rhythm with nonspecific T wave flattening.  Overall normal EKG.  Recent Labs: No results found for requested labs within last 365 days.  Recent Lipid Panel No results found for: "CHOL", "TRIG", "HDL", "CHOLHDL", "VLDL", "LDLCALC", "LDLDIRECT"  Physical Exam:    VS:  BP 118/88   Pulse 83   Ht 5\' 9"  (1.753 m)   Wt 237 lb 12.8 oz (107.9 kg)   SpO2 96%   BMI 35.12 kg/m     Wt Readings from Last 3 Encounters:  04/08/22 237 lb 12.8 oz (107.9 kg)  01/18/21 230 lb (104.3 kg)  05/01/13 195 lb (88.5 kg)     GEN: Overweight. No acute  distress HEENT: Normal NECK: No JVD. LYMPHATICS: No lymphadenopathy CARDIAC: No murmur. RRR no gallop, or edema. VASCULAR:  Normal Pulses. No bruits. RESPIRATORY:  Clear to auscultation without rales, wheezing or rhonchi  ABDOMEN: Soft, non-tender, non-distended, No pulsatile mass, MUSCULOSKELETAL: No deformity  SKIN: Warm and dry NEUROLOGIC:  Alert and oriented x 3 PSYCHIATRIC:  Normal affect   ASSESSMENT:    1. Palpitations   2. Primary hypertension   3. Alcohol dependence with unspecified alcohol-induced disorder (HCC)    PLAN:    In order of problems listed above:  2-week monitor to exclude atrial fibrillation/PSVT/and other. Blood pressure is adequately controlled today.  If we see arrhythmia, perhaps a beta-blocker will be added.  He is on Migratine and states that he takes it as recommended. Still drinks relatively frequently but not daily.  Every other day he will have a couple drinks.   2-week monitor.  If we fail to identify   Medication Adjustments/Labs and Tests Ordered: Current medicines are reviewed at length with the patient today.  Concerns regarding medicines are outlined above.  No orders of the defined types were placed in this encounter.  No orders of the defined types were placed in this encounter.   There are no Patient Instructions on file for this visit.   Signed, 06/29/13, MD  04/08/2022 4:39 PM    [facility]

## 2022-04-08 ENCOUNTER — Ambulatory Visit (INDEPENDENT_AMBULATORY_CARE_PROVIDER_SITE_OTHER): Payer: BC Managed Care – PPO

## 2022-04-08 ENCOUNTER — Encounter: Payer: Self-pay | Admitting: Interventional Cardiology

## 2022-04-08 ENCOUNTER — Ambulatory Visit: Payer: BC Managed Care – PPO | Attending: Interventional Cardiology | Admitting: Interventional Cardiology

## 2022-04-08 VITALS — BP 118/88 | HR 83 | Ht 69.0 in | Wt 237.8 lb

## 2022-04-08 DIAGNOSIS — R002 Palpitations: Secondary | ICD-10-CM | POA: Diagnosis not present

## 2022-04-08 DIAGNOSIS — I10 Essential (primary) hypertension: Secondary | ICD-10-CM

## 2022-04-08 DIAGNOSIS — F1029 Alcohol dependence with unspecified alcohol-induced disorder: Secondary | ICD-10-CM | POA: Diagnosis not present

## 2022-04-08 NOTE — Patient Instructions (Signed)
Medication Instructions:  Your physician recommends that you continue on your current medications as directed. Please refer to the Current Medication list given to you today.  *If you need a refill on your cardiac medications before your next appointment, please call your pharmacy*  Testing/Procedures: Your physician has requested that you wear a Zio heart monitor for 14 days. This will be mailed to your home with instructions on how to apply the monitor and how to return it when finished. Please allow 2 weeks after returning the heart monitor before our office calls you with the results.   Follow-Up: Will be determined based on results of heart monitor.  Other Instructions ZIO XT- Long Term Monitor Instructions     Your physician has requested you wear a ZIO patch monitor for 14 days.  This is a single patch monitor. [employer] supplies one patch monitor per enrollment. Additional  stickers are not available. Please do not apply patch if you will be having a Nuclear Stress Test,  Echocardiogram, Cardiac CT, MRI, or Chest Xray during the period you would be wearing the  monitor. The patch cannot be worn during these tests. You cannot remove and re-apply the  ZIO XT patch monitor.  Your ZIO patch monitor will be mailed 3 day USPS to your address on file. It may take 3-5 days  to receive your monitor after you have been enrolled.  Once you have received your monitor, please review the enclosed instructions. Your monitor  has already been registered assigning a specific monitor serial # to you.     Billing and Patient Assistance Program Information     We have supplied [employer] with any of your insurance information on file for billing purposes.  [employer] offers a sliding scale Patient Assistance Program for patients that do not have  insurance, or whose insurance does not completely cover the cost of the ZIO monitor.  You must apply for the Patient Assistance Program to qualify for this  discounted rate.  To apply, please call [employer] at [phone], select option 4, select option 2, ask to apply for  Patient Assistance Program. [employer] will ask your household income, and how many people  are in your household. They will quote your out-of-pocket cost based on that information.  [employer] will also be able to set up a 12-month, interest-free payment plan if needed.     Applying the monitor     Shave hair from upper left chest.  Hold abrader disc by orange tab. Rub abrader in 40 strokes over the upper left chest as  indicated in your monitor instructions.  Clean area with 4 enclosed alcohol pads. Let dry.  Apply patch as indicated in monitor instructions. Patch will be placed under collarbone on left  side of chest with arrow pointing upward.  Rub patch adhesive wings for 2 minutes. Remove white label marked "1". Remove the white  label marked "2". Rub patch adhesive wings for 2 additional minutes.  While looking in a mirror, press and release button in center of patch. A small green light will  flash 3-4 times. This will be your only indicator that the monitor has been turned on.  Do not shower for the first 24 hours. You may shower after the first 24 hours.  Press the button if you feel a symptom. You will hear a small click. Record Date, Time and  Symptom in the Patient Logbook.  When you are ready to remove the patch, follow instructions on the last 2   pages of Patient  Logbook. Stick patch monitor onto the last page of Patient Logbook.  Place Patient Logbook in the blue and white box. Use locking tab on box and tape box closed  securely. The blue and white box has prepaid postage on it. Please place it in the mailbox as  soon as possible. Your physician should have your test results approximately 7 days after the  monitor has been mailed back to [employer].  Call [employer] Customer Care at [phone] if you have questions regarding  your ZIO XT patch monitor.  Call them immediately if you see an orange light blinking on your  monitor.  If your monitor falls off in less than 4 days, contact our Monitor department at [phone].  If your monitor becomes loose or falls off after 4 days call [employer] at [phone] for  suggestions on securing your monitor.    Important Information About Sugar       

## 2022-04-08 NOTE — Progress Notes (Unsigned)
Enrolled for [employer] to mail a ZIO XT long term holter monitor to the patients address on file.  

## 2022-04-09 DIAGNOSIS — F331 Major depressive disorder, recurrent, moderate: Secondary | ICD-10-CM | POA: Diagnosis not present

## 2022-04-09 DIAGNOSIS — F411 Generalized anxiety disorder: Secondary | ICD-10-CM | POA: Diagnosis not present

## 2022-04-15 DIAGNOSIS — I10 Essential (primary) hypertension: Secondary | ICD-10-CM | POA: Diagnosis not present

## 2022-04-15 DIAGNOSIS — M791 Myalgia, unspecified site: Secondary | ICD-10-CM | POA: Diagnosis not present

## 2022-04-15 DIAGNOSIS — J0100 Acute maxillary sinusitis, unspecified: Secondary | ICD-10-CM | POA: Diagnosis not present

## 2022-04-18 DIAGNOSIS — R002 Palpitations: Secondary | ICD-10-CM | POA: Diagnosis not present

## 2022-04-23 DIAGNOSIS — G4733 Obstructive sleep apnea (adult) (pediatric): Secondary | ICD-10-CM | POA: Diagnosis not present

## 2022-05-13 DIAGNOSIS — G4733 Obstructive sleep apnea (adult) (pediatric): Secondary | ICD-10-CM | POA: Diagnosis not present

## 2022-05-13 DIAGNOSIS — R002 Palpitations: Secondary | ICD-10-CM | POA: Diagnosis not present

## 2022-05-15 DIAGNOSIS — F339 Major depressive disorder, recurrent, unspecified: Secondary | ICD-10-CM | POA: Diagnosis not present

## 2022-05-15 DIAGNOSIS — E781 Pure hyperglyceridemia: Secondary | ICD-10-CM | POA: Diagnosis not present

## 2022-05-15 DIAGNOSIS — I10 Essential (primary) hypertension: Secondary | ICD-10-CM | POA: Diagnosis not present

## 2022-05-20 ENCOUNTER — Telehealth: Payer: Self-pay | Admitting: *Deleted

## 2022-05-20 NOTE — Telephone Encounter (Signed)
-----  Message from Burnell Blanks, MD sent at 05/13/2022  2:38 PM EST ----- Pt of Dr. Tamala Julian. I read his monitor today. Short runs of SVT. He should be seen in the office by an office APP to review this and discuss starting a beta blocker or Cardizem. Gerald Stabs

## 2022-05-20 NOTE — Telephone Encounter (Signed)
Called patient and advised of results and arranged APP follow up for next week.

## 2022-05-24 DIAGNOSIS — G4733 Obstructive sleep apnea (adult) (pediatric): Secondary | ICD-10-CM | POA: Diagnosis not present

## 2022-05-25 NOTE — Progress Notes (Signed)
Cardiology Office Note:    Date:  05/26/2022   ID:  Scott Bowman, DOB 06-27-1975, MRN [id number]  PCP:  Aliene Beams, MD   [facility] Providers Cardiologist:  None     Referring MD: No ref. provider found   Chief Complaint  Patient presents with   follow up for palpitations    Seen for Dr. Katrinka Blazing    History of Present Illness:    Scott Bowman is a 47 y.o. male with a hx of palpitations, hypertension, hyperlipidemia, OSA (wears CPAP), asthma, EtOH dependence, obesity.  Establish care with our office on 04/08/2022 with Dr. Katrinka Blazing, at that time he had been referred by his primary care physician as he had noted episodes of his heart racing.   7 day zio showed SR and pt triggered events associated with SVT, predominantly sinus rhythm.  He presents today for follow-up of his palpitations.  He has been trying to keep an eye on his heart rate on his smart watch but admits that it can be overwhelming with it constantly going off.  Overall, he is feeling well, he does continue to note palpitations. We discussed the results of his recent Zio monitor. Discussed potential triggers for palpitations, including OSA, caffeine, and ETOH. He denies chest pain, dyspnea, pnd, orthopnea, n, v, dizziness, syncope, edema, weight gain, or early satiety.   Reviewed recent lab work from outside of [facility], hgb 14.9 03/18/22, K 4.3 05/15/22, TSH 2.330 03/18/22.   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   chlorthalidone (HYGROTON) 25 MG tablet Take 25 mg by mouth every morning.   esomeprazole (NEXIUM) 10 MG packet Take 10 mg by mouth daily before breakfast.   FLUoxetine (PROZAC) 20 MG tablet Take 20 mg by mouth daily.   metoprolol succinate (TOPROL XL) 25 MG 24 hr tablet Take 1 tablet (25 mg total) by mouth daily.   Multiple Vitamin (MULTIVITAMIN WITH MINERALS) TABS tablet Take 1 tablet by mouth daily.   VASCEPA 1 g capsule Take 4 g by mouth daily. Pt takes 2 caps 8 g in the morning, 2 caps 8 g in the evening.     Allergies:   Amoxicillin   Social History   Socioeconomic History   Marital status: Married    Spouse name: Not on file   Number of children: Not on file   Years of education: Not on file   Highest education level: Not on file  Occupational History   Not on file  Tobacco Use   Smoking status: Never    Passive exposure: Past   Smokeless tobacco: Never  Vaping Use   Vaping Use: Never used  Substance and Sexual Activity   Alcohol use: Yes  Comment: couple of beers a day   Drug use: No   Sexual activity: Not on file  Other Topics Concern   Not on file  Social History Narrative   Not on file   Social Determinants of Health   Financial Resource Strain: Not on file  Food Insecurity: Not on file  Transportation Needs: Not on file  Physical Activity: Not on file  Stress: Not on file  Social Connections: Not on file     Family History: The patient's family history is not on file.  ROS:   Please see the history of present illness.     All other systems reviewed and are negative.  EKGs/Labs/Other Studies Reviewed:    The following studies were reviewed today:  05/13/22 7 day Zio monitor -  Sinus rhythm. (min HR of 56 bpm, max HR of 174 bpm, and avg HR of 90 bpm).  6 Supraventricular Tachycardia  runs occurred, the longest lasting 15 beats. Supraventricular Tachycardia was detected within +/- 45 seconds of symptomatic patient event(s).  Rare premature atrial contractions (<1.0%) Rare premature ventricular contractions (<1.0%, 11)  EKG:  EKG is not ordered today.   Recent Labs: No results found for requested labs within last 365 days.  Recent Lipid Panel No results found for: "CHOL", "TRIG", "HDL", "CHOLHDL", "VLDL", "LDLCALC", "LDLDIRECT"   Risk Assessment/Calculations:                Physical Exam:    VS:  BP 108/74   Pulse (!) 101   Ht 5\' 9"  (1.753 m)   Wt 234 lb (106.1 kg)   SpO2 97%   BMI 34.56 kg/m     Wt Readings from Last 3 Encounters:  05/26/22 234 lb (106.1 kg)  04/08/22 237 lb 12.8 oz (107.9 kg)  01/18/21 230 lb (104.3 kg)     GEN:  Well nourished, well developed in no acute distress HEENT: Normal NECK: No JVD; No carotid bruits LYMPHATICS: No lymphadenopathy CARDIAC: RRR, no murmurs, rubs, gallops RESPIRATORY:  Clear to auscultation without rales, wheezing or rhonchi  ABDOMEN: Soft, non-tender, non-distended MUSCULOSKELETAL:  No edema; No deformity  SKIN: Warm and dry NEUROLOGIC:  Alert and oriented x 3 PSYCHIATRIC:  Normal affect   ASSESSMENT:    1. Palpitations   2. PSVT (paroxysmal supraventricular tachycardia)   3. OSA on CPAP   4. Essential hypertension   5. Hyperlipidemia, unspecified hyperlipidemia type    PLAN:    In order of problems listed above:  Palpitations/PSVT - 7 day zio revealed runs of PSVT, they are notable and somewhat bothersome. HR today is 101. Zio monitor showed average HR 90 bpm. He is agreeable to start metoprolol succinate 25 mg daily.   OSA - wears CPAP nightly, managed by PCP  HTN - BP well controlled today 108/74, on amlodipine and chlorthalidone, managed by PCP. If BP decreases with metoprolol, could decrease his amlodipine to 5 mg daily.   HLD - LDL 69 on 05/15/22, well controlled, on vascepa, managed  by PCP.   Disposition - start metoprolol succinate 25 mg daily. If BP is intolerant, consider decreasing amlodipine. Return in 6 months with APP or Dr. Angelena Form.            Medication Adjustments/Labs and Tests Ordered: Current medicines are reviewed at length with the patient today.  Concerns regarding medicines are outlined above.  No orders of the defined types were placed in this encounter.  Meds ordered this encounter  Medications   metoprolol succinate (  TOPROL XL) 25 MG 24 hr tablet    Sig: Take 1 tablet (25 mg total) by mouth daily.    Dispense:  90 tablet    Refill:  3    Patient Instructions  Medication Instructions:  1.Start metoprolol succinate (Toprol XL) 25 mg, one tablet daily *If you need a refill on your cardiac medications before your next appointment, please call your pharmacy*   Lab Work: None ordered If you have labs (blood work) drawn today and your tests are completely normal, you will receive your results only by: [place] (if you have MyChart) OR A paper copy in the mail If you have any lab test that is abnormal or we need to change your treatment, we will call you to review the results.   Follow-Up: At [facility], you and your health needs are our priority.  As part of our continuing mission to provide you with exceptional heart care, we have created designated Provider Care Teams.  These Care Teams include your primary Cardiologist (physician) and Advanced Practice Providers (APPs -  Physician Assistants and Nurse Practitioners) who all work together to provide you with the care you need, when you need it.   Your next appointment:   6 month(s)  Provider:   Dr Angelena Form or Richardson Dopp, PA-C          Signed, Trudi Ida, NP  05/26/2022 3:11 PM    [place]

## 2022-05-26 ENCOUNTER — Ambulatory Visit: Payer: BC Managed Care – PPO | Attending: Physician Assistant | Admitting: Cardiology

## 2022-05-26 ENCOUNTER — Encounter: Payer: Self-pay | Admitting: Cardiology

## 2022-05-26 VITALS — BP 108/74 | HR 101 | Ht 69.0 in | Wt 234.0 lb

## 2022-05-26 DIAGNOSIS — I10 Essential (primary) hypertension: Secondary | ICD-10-CM

## 2022-05-26 DIAGNOSIS — R002 Palpitations: Secondary | ICD-10-CM | POA: Diagnosis not present

## 2022-05-26 DIAGNOSIS — G4733 Obstructive sleep apnea (adult) (pediatric): Secondary | ICD-10-CM

## 2022-05-26 DIAGNOSIS — I471 Supraventricular tachycardia, unspecified: Secondary | ICD-10-CM | POA: Diagnosis not present

## 2022-05-26 DIAGNOSIS — E785 Hyperlipidemia, unspecified: Secondary | ICD-10-CM

## 2022-05-26 MED ORDER — METOPROLOL SUCCINATE ER 25 MG PO TB24: 25.0000 mg | ORAL_TABLET | Freq: Every day | ORAL | 3 refills | Status: DC

## 2022-05-26 NOTE — Patient Instructions (Addendum)
Medication Instructions:  1.Start metoprolol succinate (Toprol XL) 25 mg, one tablet daily *If you need a refill on your cardiac medications before your next appointment, please call your pharmacy*   Lab Work: None ordered If you have labs (blood work) drawn today and your tests are completely normal, you will receive your results only by: [place] (if you have MyChart) OR A paper copy in the mail If you have any lab test that is abnormal or we need to change your treatment, we will call you to review the results.   Follow-Up: At [facility], you and your health needs are our priority.  As part of our continuing mission to provide you with exceptional heart care, we have created designated Provider Care Teams.  These Care Teams include your primary Cardiologist (physician) and Advanced Practice Providers (APPs -  Physician Assistants and Nurse Practitioners) who all work together to provide you with the care you need, when you need it.   Your next appointment:   6 month(s)  Provider:   Dr Angelena Form or Richardson Dopp, PA-C

## 2022-06-10 DIAGNOSIS — H5330 Unspecified disorder of binocular vision: Secondary | ICD-10-CM | POA: Diagnosis not present

## 2022-06-14 DIAGNOSIS — G4733 Obstructive sleep apnea (adult) (pediatric): Secondary | ICD-10-CM | POA: Diagnosis not present

## 2022-06-24 DIAGNOSIS — F331 Major depressive disorder, recurrent, moderate: Secondary | ICD-10-CM | POA: Diagnosis not present

## 2022-06-24 DIAGNOSIS — F411 Generalized anxiety disorder: Secondary | ICD-10-CM | POA: Diagnosis not present

## 2022-06-24 DIAGNOSIS — G4733 Obstructive sleep apnea (adult) (pediatric): Secondary | ICD-10-CM | POA: Diagnosis not present

## 2022-07-13 DIAGNOSIS — G4733 Obstructive sleep apnea (adult) (pediatric): Secondary | ICD-10-CM | POA: Diagnosis not present

## 2022-07-20 DIAGNOSIS — H524 Presbyopia: Secondary | ICD-10-CM | POA: Diagnosis not present

## 2022-07-23 DIAGNOSIS — G4733 Obstructive sleep apnea (adult) (pediatric): Secondary | ICD-10-CM | POA: Diagnosis not present

## 2022-08-05 DIAGNOSIS — F411 Generalized anxiety disorder: Secondary | ICD-10-CM | POA: Diagnosis not present

## 2022-08-05 DIAGNOSIS — F331 Major depressive disorder, recurrent, moderate: Secondary | ICD-10-CM | POA: Diagnosis not present

## 2022-08-09 IMAGING — DX DG OS CALCIS 2+V*L*
1 series · 2 of 2 positions shown · non-contrast
Comparison: None.

CLINICAL DATA: Stepped on object yesterday now with heel tenderness
and puncture wound with surrounding redness.

EXAM:
LEFT OS CALCIS - 2+ VIEW

[Series 1: calcaneus · 0.14mm/px · 2 of 2 slices shown]
[im 1/2]
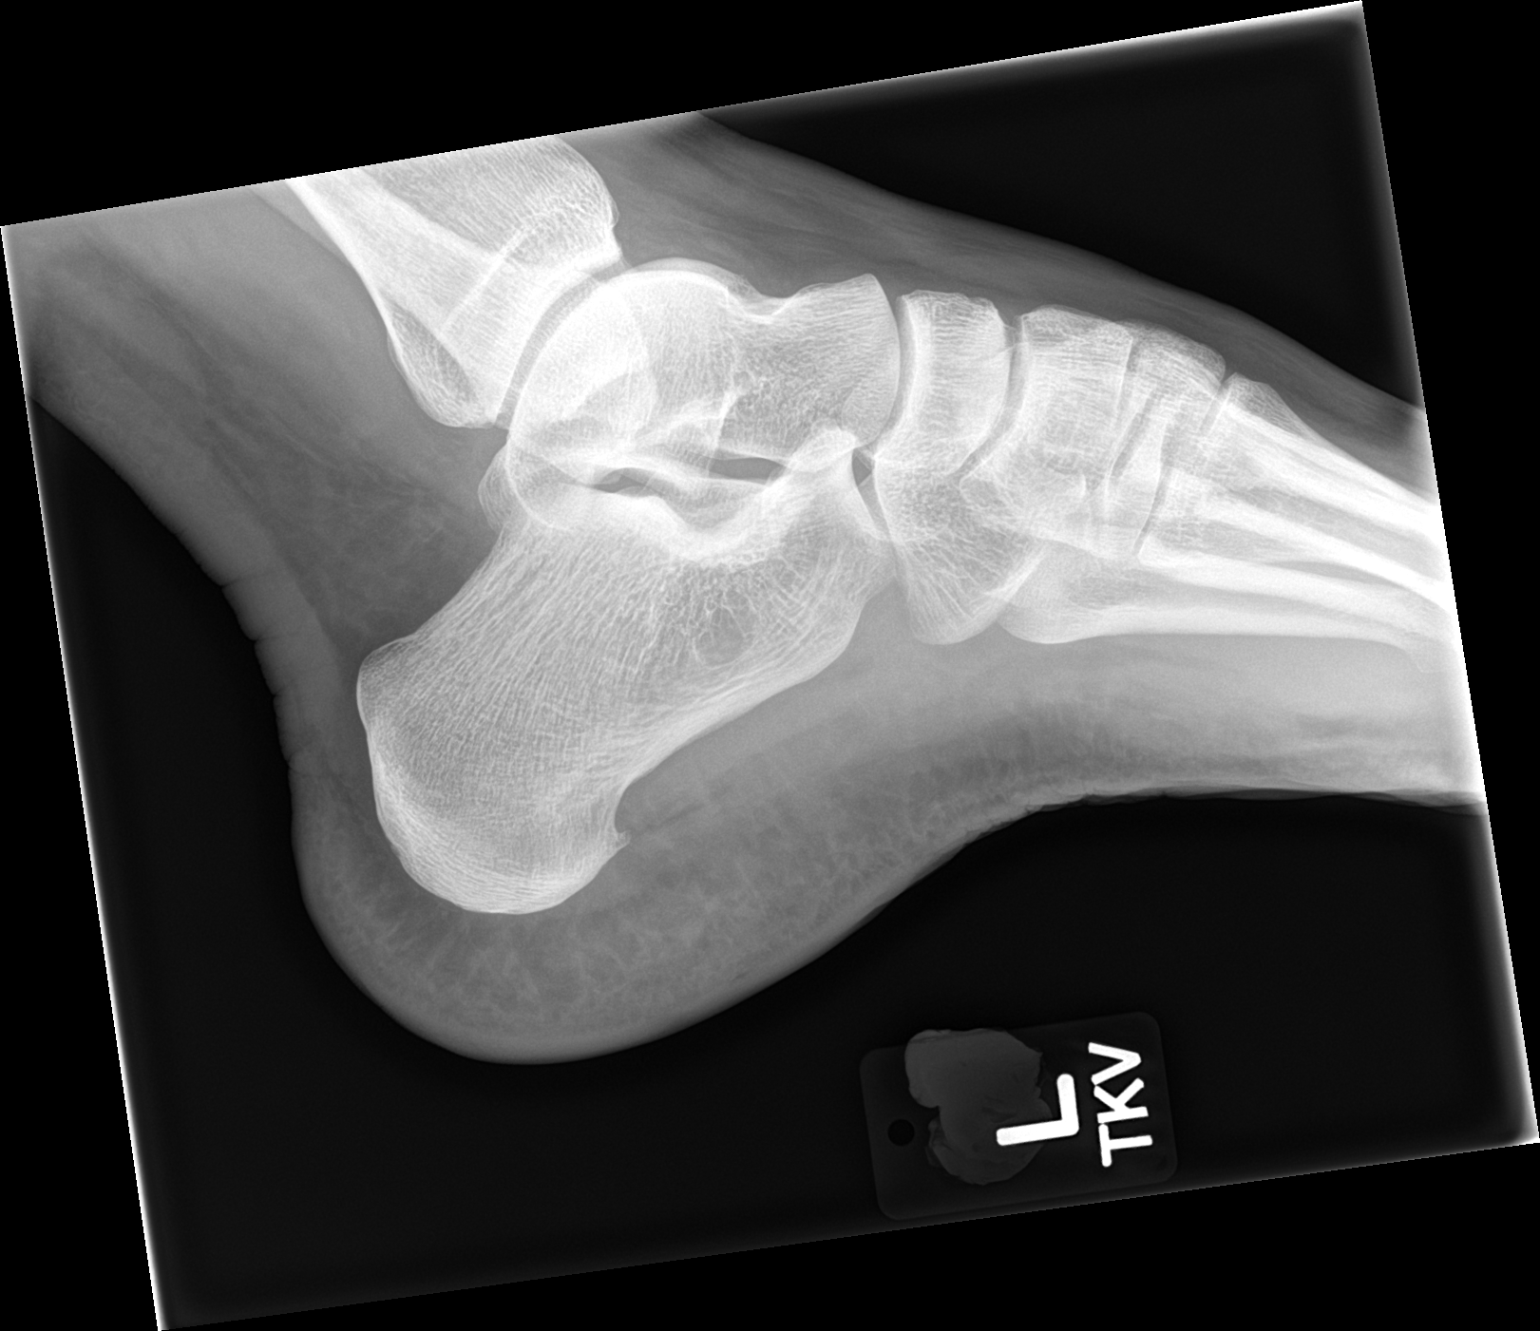
[im 2/2]
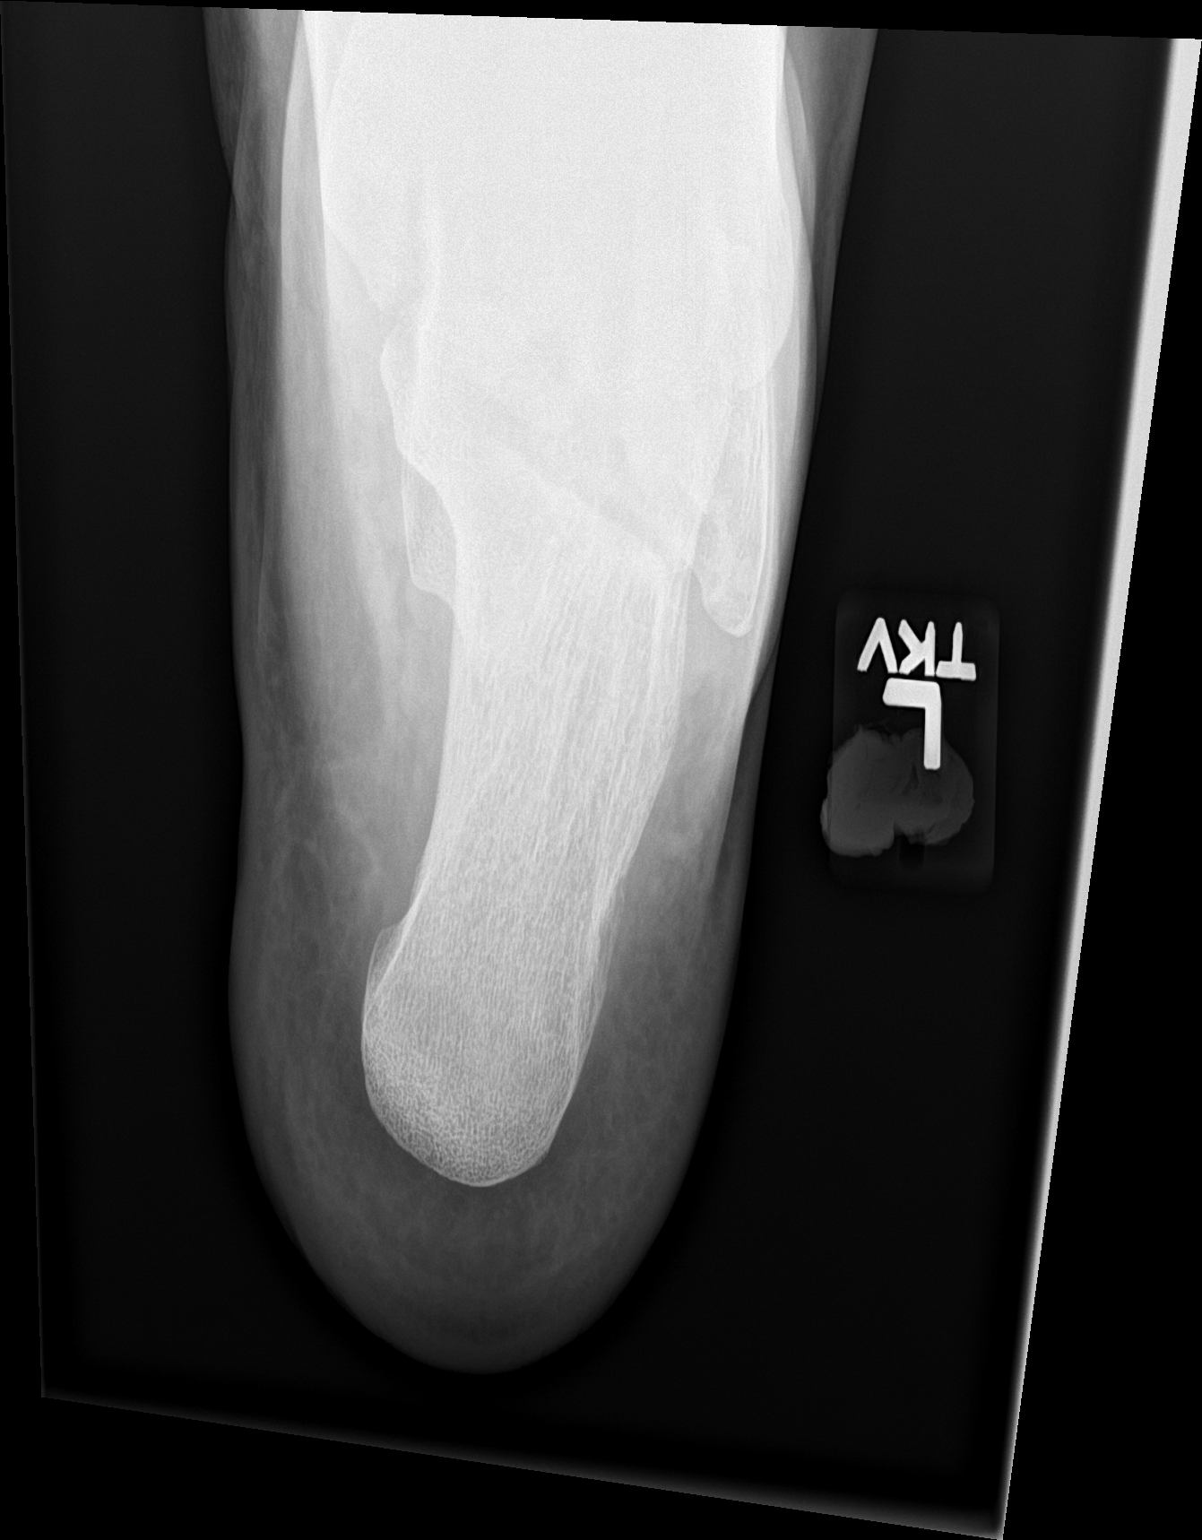

[2 of 2 positions shown; findings below may reference images not displayed]

FINDINGS: No acute osseous abnormality. No fracture or dislocation. No
radiopaque foreign bodies identified. Soft tissues appear edematous.
IMPRESSION: Soft tissue swelling.

## 2022-08-13 DIAGNOSIS — G4733 Obstructive sleep apnea (adult) (pediatric): Secondary | ICD-10-CM | POA: Diagnosis not present

## 2022-08-13 DIAGNOSIS — E781 Pure hyperglyceridemia: Secondary | ICD-10-CM | POA: Diagnosis not present

## 2022-08-13 DIAGNOSIS — I10 Essential (primary) hypertension: Secondary | ICD-10-CM | POA: Diagnosis not present

## 2022-08-21 DIAGNOSIS — I10 Essential (primary) hypertension: Secondary | ICD-10-CM | POA: Diagnosis not present

## 2022-08-21 DIAGNOSIS — E781 Pure hyperglyceridemia: Secondary | ICD-10-CM | POA: Diagnosis not present

## 2022-08-21 DIAGNOSIS — E785 Hyperlipidemia, unspecified: Secondary | ICD-10-CM | POA: Diagnosis not present

## 2022-08-21 DIAGNOSIS — R351 Nocturia: Secondary | ICD-10-CM | POA: Diagnosis not present

## 2022-08-23 DIAGNOSIS — G4733 Obstructive sleep apnea (adult) (pediatric): Secondary | ICD-10-CM | POA: Diagnosis not present

## 2022-08-24 DIAGNOSIS — I10 Essential (primary) hypertension: Secondary | ICD-10-CM | POA: Diagnosis not present

## 2022-09-22 DIAGNOSIS — G4733 Obstructive sleep apnea (adult) (pediatric): Secondary | ICD-10-CM | POA: Diagnosis not present

## 2022-09-24 DIAGNOSIS — M9901 Segmental and somatic dysfunction of cervical region: Secondary | ICD-10-CM | POA: Diagnosis not present

## 2022-09-24 DIAGNOSIS — M9902 Segmental and somatic dysfunction of thoracic region: Secondary | ICD-10-CM | POA: Diagnosis not present

## 2022-09-24 DIAGNOSIS — M9903 Segmental and somatic dysfunction of lumbar region: Secondary | ICD-10-CM | POA: Diagnosis not present

## 2022-09-30 DIAGNOSIS — Z79899 Other long term (current) drug therapy: Secondary | ICD-10-CM | POA: Diagnosis not present

## 2022-09-30 DIAGNOSIS — E781 Pure hyperglyceridemia: Secondary | ICD-10-CM | POA: Diagnosis not present

## 2022-10-22 DIAGNOSIS — M9902 Segmental and somatic dysfunction of thoracic region: Secondary | ICD-10-CM | POA: Diagnosis not present

## 2022-10-22 DIAGNOSIS — M9903 Segmental and somatic dysfunction of lumbar region: Secondary | ICD-10-CM | POA: Diagnosis not present

## 2022-10-22 DIAGNOSIS — M9901 Segmental and somatic dysfunction of cervical region: Secondary | ICD-10-CM | POA: Diagnosis not present

## 2022-10-23 DIAGNOSIS — G4733 Obstructive sleep apnea (adult) (pediatric): Secondary | ICD-10-CM | POA: Diagnosis not present

## 2022-10-27 DIAGNOSIS — R7301 Impaired fasting glucose: Secondary | ICD-10-CM | POA: Diagnosis not present

## 2022-10-27 DIAGNOSIS — R7401 Elevation of levels of liver transaminase levels: Secondary | ICD-10-CM | POA: Diagnosis not present

## 2022-11-03 NOTE — Progress Notes (Unsigned)
No chief complaint on file.  History of Present Illness: 47 yo male wit history of HTN, HLD, sleep apnea, asthma, etoh dependence, obesity and palpitations who is here today for cardiac follow up. He was seen in 2023 by Dr. Katrinka Blazing for evaluation of palpitations. Cardiac monitor in 2023 showed sinus and short runs of SVT. He was started on Toprol.   He is here today for follow up. The patient denies any chest pain, dyspnea, palpitations, lower extremity edema, orthopnea, PND, dizziness, near syncope or syncope.   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     chlorthalidone (HYGROTON) 25 MG tablet Take 25 mg by mouth every morning.     esomeprazole (NEXIUM) 10 MG packet Take 10 mg by mouth daily before breakfast.     FLUoxetine (PROZAC) 20 MG tablet Take 20 mg by mouth daily.     metoprolol succinate (TOPROL XL) 25 MG 24 hr tablet Take 1 tablet (25 mg total) by mouth daily. 90 tablet 3   Multiple Vitamin (MULTIVITAMIN WITH MINERALS) TABS tablet Take 1 tablet by mouth daily.     VASCEPA 1 g capsule Take 4 g by mouth daily. Pt takes 2 caps 8 g in the morning, 2 caps 8 g in the evening.     No current facility-administered medications for this visit.    Allergies  Allergen  Reactions   Amoxicillin Rash    Social History   Socioeconomic History   Marital status: Married    Spouse name: Not on file   Number of children: Not on file   Years of education: Not on file   Highest education level: Not on file  Occupational History   Not on file  Tobacco Use   Smoking status: Never    Passive exposure: Past   Smokeless tobacco: Never  Vaping Use   Vaping Use: Never used  Substance and Sexual Activity   Alcohol use: Yes    Comment: couple of beers a day   Drug use: No   Sexual activity: Not on file  Other Topics Concern   Not on file  Social History Narrative   Not on file   Social Determinants of Health   Financial Resource Strain: Not on file  Food Insecurity: Not on file  Transportation Needs: Not on file  Physical Activity: Not on file  Stress: Not on file  Social Connections: Not on file  Intimate Partner Violence: Not on file    No family history on file.  Review of Systems:  As stated in the HPI and otherwise negative.   There were no vitals taken for this visit.  Physical Examination: General:  Well developed, well nourished, NAD  HEENT: OP clear, mucus membranes moist  SKIN: warm, dry. No rashes. Neuro: No focal deficits  Musculoskeletal: Muscle strength 5/5 all ext  Psychiatric: Mood and affect normal  Neck: No JVD, no carotid bruits, no thyromegaly, no lymphadenopathy.  Lungs:Clear bilaterally, no wheezes, rhonci, crackles Cardiovascular: Regular rate and rhythm. No murmurs, gallops or rubs. Abdomen:Soft. Bowel sounds present. Non-tender.  Extremities: No lower extremity edema. Pulses are 2 + in the bilateral DP/PT.  EKG:  EKG {ACTION; IS/IS [id number]} ordered today. The ekg ordered today demonstrates ***  Recent Labs: No results found for requested labs within last 365 days.   Lipid Panel No results found for: "CHOL", "TRIG", "HDL", "CHOLHDL", "VLDL", "LDLCALC", "LDLDIRECT"   Wt Readings from Last 3 Encounters:   05/26/22 106.1 kg  04/08/22 107.9 kg  01/18/21 104.3 kg      Assessment and Plan:   1. SVT: ***. Continue Toprol. *** ? Echo.   Labs/ tests ordered today include:  No orders of the defined types were placed in this encounter.    Disposition:   F/U with me in ***    Signed, Verne Carrow, MD, [facility] 11/03/2022 12:35 PM    [facility] [address], [place], [place]  [address] Phone: [phone]; Fax: [phone]

## 2022-11-04 ENCOUNTER — Encounter: Payer: Self-pay | Admitting: Cardiovascular Disease

## 2022-11-04 ENCOUNTER — Ambulatory Visit: Payer: BC Managed Care – PPO | Attending: Cardiovascular Disease | Admitting: Cardiovascular Disease

## 2022-11-04 VITALS — BP 126/86 | HR 70 | Ht 69.0 in | Wt 222.4 lb

## 2022-11-04 DIAGNOSIS — F411 Generalized anxiety disorder: Secondary | ICD-10-CM | POA: Diagnosis not present

## 2022-11-04 DIAGNOSIS — F331 Major depressive disorder, recurrent, moderate: Secondary | ICD-10-CM | POA: Diagnosis not present

## 2022-11-04 DIAGNOSIS — I471 Supraventricular tachycardia, unspecified: Secondary | ICD-10-CM | POA: Diagnosis not present

## 2022-11-04 DIAGNOSIS — R002 Palpitations: Secondary | ICD-10-CM | POA: Diagnosis not present

## 2022-11-04 NOTE — Patient Instructions (Signed)
Medication Instructions:  No changes *If you need a refill on your cardiac medications before your next appointment, please call your pharmacy*   Lab Work: none If you have labs (blood work) drawn today and your tests are completely normal, you will receive your results only by: MyChart Message (if you have MyChart) OR A paper copy in the mail If you have any lab test that is abnormal or we need to change your treatment, we will call you to review the results.   Testing/Procedures: none   Follow-Up: At [facility], you and your health needs are our priority.  As part of our continuing mission to provide you with exceptional heart care, we have created designated Provider Care Teams.  These Care Teams include your primary Cardiologist (physician) and Advanced Practice Providers (APPs -  Physician Assistants and Nurse Practitioners) who all work together to provide you with the care you need, when you need it.   Your next appointment:   12 month(s)  Provider:   Christopher McAlhany, MD      

## 2022-11-09 DIAGNOSIS — M9902 Segmental and somatic dysfunction of thoracic region: Secondary | ICD-10-CM | POA: Diagnosis not present

## 2022-11-09 DIAGNOSIS — M6240 Contracture of muscle, unspecified site: Secondary | ICD-10-CM | POA: Diagnosis not present

## 2022-11-09 DIAGNOSIS — M9901 Segmental and somatic dysfunction of cervical region: Secondary | ICD-10-CM | POA: Diagnosis not present

## 2022-11-09 DIAGNOSIS — M9903 Segmental and somatic dysfunction of lumbar region: Secondary | ICD-10-CM | POA: Diagnosis not present

## 2022-11-11 DIAGNOSIS — M9901 Segmental and somatic dysfunction of cervical region: Secondary | ICD-10-CM | POA: Diagnosis not present

## 2022-11-11 DIAGNOSIS — M9903 Segmental and somatic dysfunction of lumbar region: Secondary | ICD-10-CM | POA: Diagnosis not present

## 2022-11-11 DIAGNOSIS — M6240 Contracture of muscle, unspecified site: Secondary | ICD-10-CM | POA: Diagnosis not present

## 2022-11-11 DIAGNOSIS — M9902 Segmental and somatic dysfunction of thoracic region: Secondary | ICD-10-CM | POA: Diagnosis not present

## 2022-11-23 DIAGNOSIS — J0100 Acute maxillary sinusitis, unspecified: Secondary | ICD-10-CM | POA: Diagnosis not present

## 2022-11-26 DIAGNOSIS — M25511 Pain in right shoulder: Secondary | ICD-10-CM | POA: Diagnosis not present

## 2022-12-03 DIAGNOSIS — M9907 Segmental and somatic dysfunction of upper extremity: Secondary | ICD-10-CM | POA: Diagnosis not present

## 2022-12-03 DIAGNOSIS — M9903 Segmental and somatic dysfunction of lumbar region: Secondary | ICD-10-CM | POA: Diagnosis not present

## 2022-12-03 DIAGNOSIS — M9902 Segmental and somatic dysfunction of thoracic region: Secondary | ICD-10-CM | POA: Diagnosis not present

## 2022-12-03 DIAGNOSIS — M9901 Segmental and somatic dysfunction of cervical region: Secondary | ICD-10-CM | POA: Diagnosis not present

## 2022-12-08 ENCOUNTER — Ambulatory Visit: Payer: BC Managed Care – PPO | Admitting: Cardiovascular Disease

## 2022-12-14 DIAGNOSIS — G4733 Obstructive sleep apnea (adult) (pediatric): Secondary | ICD-10-CM | POA: Diagnosis not present

## 2022-12-29 DIAGNOSIS — M9902 Segmental and somatic dysfunction of thoracic region: Secondary | ICD-10-CM | POA: Diagnosis not present

## 2022-12-29 DIAGNOSIS — M9903 Segmental and somatic dysfunction of lumbar region: Secondary | ICD-10-CM | POA: Diagnosis not present

## 2022-12-29 DIAGNOSIS — M9907 Segmental and somatic dysfunction of upper extremity: Secondary | ICD-10-CM | POA: Diagnosis not present

## 2022-12-29 DIAGNOSIS — M9901 Segmental and somatic dysfunction of cervical region: Secondary | ICD-10-CM | POA: Diagnosis not present

## 2023-01-07 DIAGNOSIS — Z23 Encounter for immunization: Secondary | ICD-10-CM | POA: Diagnosis not present

## 2023-01-07 DIAGNOSIS — E785 Hyperlipidemia, unspecified: Secondary | ICD-10-CM | POA: Diagnosis not present

## 2023-01-07 DIAGNOSIS — I10 Essential (primary) hypertension: Secondary | ICD-10-CM | POA: Diagnosis not present

## 2023-01-07 DIAGNOSIS — Z0000 Encounter for general adult medical examination without abnormal findings: Secondary | ICD-10-CM | POA: Diagnosis not present

## 2023-01-07 DIAGNOSIS — E781 Pure hyperglyceridemia: Secondary | ICD-10-CM | POA: Diagnosis not present

## 2023-01-14 DIAGNOSIS — G4733 Obstructive sleep apnea (adult) (pediatric): Secondary | ICD-10-CM | POA: Diagnosis not present

## 2023-01-19 DIAGNOSIS — M25511 Pain in right shoulder: Secondary | ICD-10-CM | POA: Diagnosis not present

## 2023-01-26 DIAGNOSIS — M9901 Segmental and somatic dysfunction of cervical region: Secondary | ICD-10-CM | POA: Diagnosis not present

## 2023-01-26 DIAGNOSIS — M9902 Segmental and somatic dysfunction of thoracic region: Secondary | ICD-10-CM | POA: Diagnosis not present

## 2023-01-26 DIAGNOSIS — M9903 Segmental and somatic dysfunction of lumbar region: Secondary | ICD-10-CM | POA: Diagnosis not present

## 2023-01-26 DIAGNOSIS — M9907 Segmental and somatic dysfunction of upper extremity: Secondary | ICD-10-CM | POA: Diagnosis not present

## 2023-01-31 DIAGNOSIS — M25511 Pain in right shoulder: Secondary | ICD-10-CM | POA: Diagnosis not present

## 2023-02-04 DIAGNOSIS — M75101 Unspecified rotator cuff tear or rupture of right shoulder, not specified as traumatic: Secondary | ICD-10-CM | POA: Diagnosis not present

## 2023-02-04 DIAGNOSIS — M7521 Bicipital tendinitis, right shoulder: Secondary | ICD-10-CM | POA: Diagnosis not present

## 2023-02-13 DIAGNOSIS — G4733 Obstructive sleep apnea (adult) (pediatric): Secondary | ICD-10-CM | POA: Diagnosis not present

## 2023-02-16 DIAGNOSIS — M25511 Pain in right shoulder: Secondary | ICD-10-CM | POA: Diagnosis not present

## 2023-03-02 DIAGNOSIS — M25511 Pain in right shoulder: Secondary | ICD-10-CM | POA: Diagnosis not present

## 2023-03-03 DIAGNOSIS — K4090 Unilateral inguinal hernia, without obstruction or gangrene, not specified as recurrent: Secondary | ICD-10-CM | POA: Diagnosis not present

## 2023-03-09 DIAGNOSIS — M25511 Pain in right shoulder: Secondary | ICD-10-CM | POA: Diagnosis not present

## 2023-03-11 DIAGNOSIS — K4020 Bilateral inguinal hernia, without obstruction or gangrene, not specified as recurrent: Secondary | ICD-10-CM | POA: Diagnosis not present

## 2023-03-15 DIAGNOSIS — G4733 Obstructive sleep apnea (adult) (pediatric): Secondary | ICD-10-CM | POA: Diagnosis not present

## 2023-03-16 DIAGNOSIS — M25511 Pain in right shoulder: Secondary | ICD-10-CM | POA: Diagnosis not present

## 2023-03-22 DIAGNOSIS — G4733 Obstructive sleep apnea (adult) (pediatric): Secondary | ICD-10-CM | POA: Diagnosis not present

## 2023-04-09 DIAGNOSIS — D176 Benign lipomatous neoplasm of spermatic cord: Secondary | ICD-10-CM | POA: Diagnosis not present

## 2023-04-09 DIAGNOSIS — K4020 Bilateral inguinal hernia, without obstruction or gangrene, not specified as recurrent: Secondary | ICD-10-CM | POA: Diagnosis not present

## 2023-04-14 DIAGNOSIS — F411 Generalized anxiety disorder: Secondary | ICD-10-CM | POA: Diagnosis not present

## 2023-04-14 DIAGNOSIS — F331 Major depressive disorder, recurrent, moderate: Secondary | ICD-10-CM | POA: Diagnosis not present

## 2023-04-14 DIAGNOSIS — G4733 Obstructive sleep apnea (adult) (pediatric): Secondary | ICD-10-CM | POA: Diagnosis not present

## 2023-05-12 DIAGNOSIS — S0501XA Injury of conjunctiva and corneal abrasion without foreign body, right eye, initial encounter: Secondary | ICD-10-CM | POA: Diagnosis not present

## 2023-05-14 ENCOUNTER — Other Ambulatory Visit: Payer: Self-pay | Admitting: Cardiology

## 2023-05-15 DIAGNOSIS — G4733 Obstructive sleep apnea (adult) (pediatric): Secondary | ICD-10-CM | POA: Diagnosis not present

## 2023-05-19 DIAGNOSIS — G4733 Obstructive sleep apnea (adult) (pediatric): Secondary | ICD-10-CM | POA: Diagnosis not present

## 2023-05-19 DIAGNOSIS — I10 Essential (primary) hypertension: Secondary | ICD-10-CM | POA: Diagnosis not present

## 2023-06-01 DIAGNOSIS — M9902 Segmental and somatic dysfunction of thoracic region: Secondary | ICD-10-CM | POA: Diagnosis not present

## 2023-06-01 DIAGNOSIS — M9903 Segmental and somatic dysfunction of lumbar region: Secondary | ICD-10-CM | POA: Diagnosis not present

## 2023-06-01 DIAGNOSIS — M9901 Segmental and somatic dysfunction of cervical region: Secondary | ICD-10-CM | POA: Diagnosis not present

## 2023-06-01 DIAGNOSIS — M6240 Contracture of muscle, unspecified site: Secondary | ICD-10-CM | POA: Diagnosis not present

## 2023-06-07 DIAGNOSIS — J0100 Acute maxillary sinusitis, unspecified: Secondary | ICD-10-CM | POA: Diagnosis not present

## 2023-06-08 DIAGNOSIS — M6240 Contracture of muscle, unspecified site: Secondary | ICD-10-CM | POA: Diagnosis not present

## 2023-06-08 DIAGNOSIS — M9903 Segmental and somatic dysfunction of lumbar region: Secondary | ICD-10-CM | POA: Diagnosis not present

## 2023-06-08 DIAGNOSIS — M9902 Segmental and somatic dysfunction of thoracic region: Secondary | ICD-10-CM | POA: Diagnosis not present

## 2023-06-16 DIAGNOSIS — G4733 Obstructive sleep apnea (adult) (pediatric): Secondary | ICD-10-CM | POA: Diagnosis not present

## 2023-06-22 DIAGNOSIS — G4733 Obstructive sleep apnea (adult) (pediatric): Secondary | ICD-10-CM | POA: Diagnosis not present

## 2023-06-22 DIAGNOSIS — M9902 Segmental and somatic dysfunction of thoracic region: Secondary | ICD-10-CM | POA: Diagnosis not present

## 2023-06-22 DIAGNOSIS — M9903 Segmental and somatic dysfunction of lumbar region: Secondary | ICD-10-CM | POA: Diagnosis not present

## 2023-06-22 DIAGNOSIS — M6240 Contracture of muscle, unspecified site: Secondary | ICD-10-CM | POA: Diagnosis not present

## 2023-07-01 DIAGNOSIS — I10 Essential (primary) hypertension: Secondary | ICD-10-CM | POA: Diagnosis not present

## 2023-07-01 DIAGNOSIS — G4733 Obstructive sleep apnea (adult) (pediatric): Secondary | ICD-10-CM | POA: Diagnosis not present

## 2023-07-01 DIAGNOSIS — G479 Sleep disorder, unspecified: Secondary | ICD-10-CM | POA: Diagnosis not present

## 2023-07-01 DIAGNOSIS — E781 Pure hyperglyceridemia: Secondary | ICD-10-CM | POA: Diagnosis not present

## 2023-07-06 DIAGNOSIS — J3489 Other specified disorders of nose and nasal sinuses: Secondary | ICD-10-CM | POA: Diagnosis not present

## 2023-07-06 DIAGNOSIS — J209 Acute bronchitis, unspecified: Secondary | ICD-10-CM | POA: Diagnosis not present

## 2023-07-13 DIAGNOSIS — M6240 Contracture of muscle, unspecified site: Secondary | ICD-10-CM | POA: Diagnosis not present

## 2023-07-13 DIAGNOSIS — M9903 Segmental and somatic dysfunction of lumbar region: Secondary | ICD-10-CM | POA: Diagnosis not present

## 2023-07-13 DIAGNOSIS — M9902 Segmental and somatic dysfunction of thoracic region: Secondary | ICD-10-CM | POA: Diagnosis not present

## 2023-07-13 DIAGNOSIS — J209 Acute bronchitis, unspecified: Secondary | ICD-10-CM | POA: Diagnosis not present

## 2023-07-22 ENCOUNTER — Telehealth: Payer: Self-pay | Admitting: Cardiovascular Disease

## 2023-07-22 MED ORDER — METOPROLOL SUCCINATE ER 25 MG PO TB24: 25.0000 mg | ORAL_TABLET | Freq: Every day | ORAL | 1 refills | Status: DC

## 2023-07-22 NOTE — Telephone Encounter (Signed)
*  STAT* If patient is at the pharmacy, call can be transferred to refill team.   1. Which medications need to be refilled? (please list name of each medication and dose if known)   metoprolol succinate (TOPROL XL) 25 MG 24 hr tablet   2. Would you like to learn more about the convenience, safety, & potential cost savings by using the [name]?   3. Are you open to using the [facility] (Type [facility]. ).  4. Which pharmacy/location (including street and city if local pharmacy) is medication to be sent to?  [id number] - [place], [place] - [address]   5. Do they need a 30 day or 90 day supply?   90 day  Patient stated he is completely out of this medication.

## 2023-07-22 NOTE — Telephone Encounter (Signed)
 Pt's medication was sent to pt's pharmacy as requested. Confirmation received.

## 2023-08-14 DIAGNOSIS — G4733 Obstructive sleep apnea (adult) (pediatric): Secondary | ICD-10-CM | POA: Diagnosis not present

## 2023-08-24 DIAGNOSIS — M9903 Segmental and somatic dysfunction of lumbar region: Secondary | ICD-10-CM | POA: Diagnosis not present

## 2023-08-24 DIAGNOSIS — M9902 Segmental and somatic dysfunction of thoracic region: Secondary | ICD-10-CM | POA: Diagnosis not present

## 2023-08-24 DIAGNOSIS — M6240 Contracture of muscle, unspecified site: Secondary | ICD-10-CM | POA: Diagnosis not present

## 2023-09-14 DIAGNOSIS — G4733 Obstructive sleep apnea (adult) (pediatric): Secondary | ICD-10-CM | POA: Diagnosis not present

## 2023-09-14 DIAGNOSIS — M9903 Segmental and somatic dysfunction of lumbar region: Secondary | ICD-10-CM | POA: Diagnosis not present

## 2023-09-14 DIAGNOSIS — M6240 Contracture of muscle, unspecified site: Secondary | ICD-10-CM | POA: Diagnosis not present

## 2023-09-14 DIAGNOSIS — M9902 Segmental and somatic dysfunction of thoracic region: Secondary | ICD-10-CM | POA: Diagnosis not present

## 2023-09-21 DIAGNOSIS — G4733 Obstructive sleep apnea (adult) (pediatric): Secondary | ICD-10-CM | POA: Diagnosis not present

## 2023-09-28 DIAGNOSIS — M9902 Segmental and somatic dysfunction of thoracic region: Secondary | ICD-10-CM | POA: Diagnosis not present

## 2023-09-28 DIAGNOSIS — M6240 Contracture of muscle, unspecified site: Secondary | ICD-10-CM | POA: Diagnosis not present

## 2023-09-28 DIAGNOSIS — M9903 Segmental and somatic dysfunction of lumbar region: Secondary | ICD-10-CM | POA: Diagnosis not present

## 2023-10-13 DIAGNOSIS — F411 Generalized anxiety disorder: Secondary | ICD-10-CM | POA: Diagnosis not present

## 2023-10-13 DIAGNOSIS — F331 Major depressive disorder, recurrent, moderate: Secondary | ICD-10-CM | POA: Diagnosis not present

## 2023-10-15 DIAGNOSIS — G4733 Obstructive sleep apnea (adult) (pediatric): Secondary | ICD-10-CM | POA: Diagnosis not present

## 2023-10-21 DIAGNOSIS — M9902 Segmental and somatic dysfunction of thoracic region: Secondary | ICD-10-CM | POA: Diagnosis not present

## 2023-10-21 DIAGNOSIS — M6240 Contracture of muscle, unspecified site: Secondary | ICD-10-CM | POA: Diagnosis not present

## 2023-10-21 DIAGNOSIS — M9903 Segmental and somatic dysfunction of lumbar region: Secondary | ICD-10-CM | POA: Diagnosis not present

## 2023-11-07 NOTE — Progress Notes (Deleted)
 No chief complaint on file.  History of Present Illness: 48 yo male wit history of HTN, HLD, sleep apnea, asthma, etoh dependence, obesity and palpitations who is here today for cardiac follow up. He was seen in 2023 by Dr. Claudene for evaluation of palpitations. Cardiac monitor in 2023 showed sinus and short runs of SVT. He was started on Toprol .   He is here today for follow up. The patient denies any chest pain, dyspnea, palpitations, lower extremity edema, orthopnea, PND, dizziness, near syncope or syncope.   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     amLODipine (NORVASC) 10 MG tablet Take 1 tablet by mouth daily.     chlorthalidone (HYGROTON) 25 MG tablet Take 25 mg by mouth every morning.     esomeprazole (NEXIUM) 10 MG packet Take 10 mg by mouth daily before breakfast.     FLUoxetine (PROZAC) 20 MG tablet Take 20 mg by mouth daily.     hydrochlorothiazide (HYDRODIURIL) 12.5 MG tablet Take 12.5 mg by mouth every morning.     metoprolol  succinate (TOPROL  XL) 25 MG 24 hr tablet Take 1 tablet (25 mg total) by mouth daily. 90 tablet 1   Multiple Vitamin (MULTIVITAMIN WITH MINERALS) TABS tablet Take 1 tablet by mouth daily.     olmesartan (BENICAR) 20  MG tablet Take 20 mg by mouth daily.     rosuvastatin (CRESTOR) 20 MG tablet Take 20 mg by mouth daily.     VASCEPA 1 g capsule Take 4 g by mouth daily. Pt takes 2 caps 8 g in the morning, 2 caps 8 g in the evening.     No current facility-administered medications for this visit.    Allergies  Allergen Reactions   Amoxicillin Rash    Social History   Socioeconomic History   Marital status: Married    Spouse name: Not on file   Number of children: 3   Years of education: Not on file   Highest education level: Not on file  Occupational History   Not on file  Tobacco Use   Smoking status: Never    Passive exposure: Past   Smokeless tobacco: Never  Vaping Use   Vaping status: Never Used  Substance and Sexual Activity   Alcohol use: Yes    Comment: couple of beers a day   Drug use: No   Sexual activity: Not on file  Other Topics Concern   Not on file  Social History Narrative   Not on file   Social Drivers of Health   Financial Resource Strain: Not on file  Food Insecurity: Not on file  Transportation  Needs: Not on file  Physical Activity: Not on file  Stress: Not on file  Social Connections: Not on file  Intimate Partner Violence: Not on file    No family history on file.  Review of Systems:  As stated in the HPI and otherwise negative.   There were no vitals taken for this visit.  Physical Examination: General: Well developed, well nourished, NAD  HEENT: OP clear, mucus membranes moist  SKIN: warm, dry. No rashes. Neuro: No focal deficits  Musculoskeletal: Muscle strength 5/5 all ext  Psychiatric: Mood and affect normal  Neck: No JVD, no carotid bruits, no thyromegaly, no lymphadenopathy.  Lungs:Clear bilaterally, no wheezes, rhonci, crackles Cardiovascular: Regular rate and rhythm. No murmurs, gallops or rubs. Abdomen:Soft. Bowel sounds present. Non-tender.  Extremities: No lower extremity edema. Pulses are 2 + in the bilateral DP/PT.  EKG:  EKG is  *** ordered today. The ekg ordered today demonstrates   Recent Labs: No results found for requested labs within last 365 days.   Lipid Panel No results found for: CHOL, TRIG, HDL, CHOLHDL, VLDL, LDLCALC, LDLDIRECT   Wt Readings from Last 3 Encounters:  11/04/22 222 lb 6.4 oz (100.9 kg)  05/26/22 234 lb (106.1 kg)  04/08/22 237 lb 12.8 oz (107.9 kg)    Assessment and Plan:   1. History of SVT: No palpitations. Continue Toprol   2. HTN: Managed in primary care. Continue current medications  Labs/ tests ordered today include:   No orders of the defined types were placed in this encounter.  Disposition:   F/U with me in one year   Signed, Lonni Cash, MD, [facility] 11/07/2023 11:56 AM    [facility] [address] [place], [place], [place]  [address] Phone: [phone]; Fax: [phone]

## 2023-11-08 ENCOUNTER — Ambulatory Visit: Attending: Cardiovascular Disease | Admitting: Cardiovascular Disease

## 2023-11-09 ENCOUNTER — Encounter: Payer: Self-pay | Admitting: Cardiovascular Disease

## 2023-11-14 DIAGNOSIS — G4733 Obstructive sleep apnea (adult) (pediatric): Secondary | ICD-10-CM | POA: Diagnosis not present

## 2023-11-22 DIAGNOSIS — H6011 Cellulitis of right external ear: Secondary | ICD-10-CM | POA: Diagnosis not present

## 2023-12-16 DIAGNOSIS — M9902 Segmental and somatic dysfunction of thoracic region: Secondary | ICD-10-CM | POA: Diagnosis not present

## 2023-12-16 DIAGNOSIS — M9901 Segmental and somatic dysfunction of cervical region: Secondary | ICD-10-CM | POA: Diagnosis not present

## 2023-12-16 DIAGNOSIS — M6240 Contracture of muscle, unspecified site: Secondary | ICD-10-CM | POA: Diagnosis not present

## 2023-12-16 DIAGNOSIS — M9903 Segmental and somatic dysfunction of lumbar region: Secondary | ICD-10-CM | POA: Diagnosis not present

## 2024-01-08 ENCOUNTER — Other Ambulatory Visit: Payer: Self-pay | Admitting: Cardiovascular Disease

## 2024-01-13 DIAGNOSIS — M9901 Segmental and somatic dysfunction of cervical region: Secondary | ICD-10-CM | POA: Diagnosis not present

## 2024-01-13 DIAGNOSIS — M9903 Segmental and somatic dysfunction of lumbar region: Secondary | ICD-10-CM | POA: Diagnosis not present

## 2024-01-13 DIAGNOSIS — M6240 Contracture of muscle, unspecified site: Secondary | ICD-10-CM | POA: Diagnosis not present

## 2024-01-13 DIAGNOSIS — M9902 Segmental and somatic dysfunction of thoracic region: Secondary | ICD-10-CM | POA: Diagnosis not present

## 2024-01-24 ENCOUNTER — Encounter: Payer: Self-pay | Admitting: Cardiovascular Disease

## 2024-01-24 ENCOUNTER — Ambulatory Visit: Attending: Cardiovascular Disease | Admitting: Cardiovascular Disease

## 2024-01-24 VITALS — BP 102/68 | HR 88 | Ht 69.0 in | Wt 237.0 lb

## 2024-01-24 DIAGNOSIS — M9903 Segmental and somatic dysfunction of lumbar region: Secondary | ICD-10-CM | POA: Diagnosis not present

## 2024-01-24 DIAGNOSIS — M9901 Segmental and somatic dysfunction of cervical region: Secondary | ICD-10-CM | POA: Diagnosis not present

## 2024-01-24 DIAGNOSIS — I10 Essential (primary) hypertension: Secondary | ICD-10-CM

## 2024-01-24 DIAGNOSIS — R002 Palpitations: Secondary | ICD-10-CM | POA: Diagnosis not present

## 2024-01-24 DIAGNOSIS — M6240 Contracture of muscle, unspecified site: Secondary | ICD-10-CM | POA: Diagnosis not present

## 2024-01-24 DIAGNOSIS — I471 Supraventricular tachycardia, unspecified: Secondary | ICD-10-CM | POA: Diagnosis not present

## 2024-01-24 DIAGNOSIS — M9902 Segmental and somatic dysfunction of thoracic region: Secondary | ICD-10-CM | POA: Diagnosis not present

## 2024-01-24 NOTE — Progress Notes (Signed)
 Chief Complaint  Patient presents with   Follow-up    SVT   History of Present Illness: 48 yo male with history of SVT, HTN, HLD, sleep apnea, asthma, etoh dependence, obesity and palpitations who is here today for cardiac follow up. He was seen in 2023 by Dr. Claudene for evaluation of palpitations. Cardiac monitor in 2023 showed sinus and short runs of SVT. He was started on Toprol . He was last seen in our office in July 2024 and was doing well.   He is here today for follow up. The patient denies any chest pain, dyspnea, palpitations, lower extremity edema, orthopnea, PND, dizziness, near syncope or syncope.   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     amLODipine (NORVASC) 10 MG tablet Take 1 tablet by mouth daily.     chlorthalidone (HYGROTON) 25 MG tablet Take 25 mg by mouth every morning.     esomeprazole (NEXIUM) 10 MG packet Take 10 mg by mouth daily before breakfast.     Fluoxetine HCl, PMDD, 20 MG TABS Take 20 mg by mouth.     hydrochlorothiazide (HYDRODIURIL) 12.5 MG tablet Take 12.5 mg by mouth every morning.     loratadine (CLARITIN REDITABS) 10 MG dissolvable tablet Take 10 mg by mouth.     metoprolol  succinate (TOPROL -XL) 25 MG 24 hr  tablet TAKE 1 TABLET (25 MG TOTAL) BY MOUTH DAILY. 30 tablet 0   Multiple Vitamin (MULTIVITAMIN WITH MINERALS) TABS tablet Take 1 tablet by mouth daily.     olmesartan (BENICAR) 20 MG tablet Take 20 mg by mouth daily.     rosuvastatin (CRESTOR) 20 MG tablet Take 20 mg by mouth daily.     VASCEPA 1 g capsule Take 4 g by mouth daily. Pt takes 2 caps 8 g in the morning, 2 caps 8 g in the evening.     No current facility-administered medications for this visit.    Allergies  Allergen Reactions   Amoxicillin Rash    Social History   Socioeconomic History   Marital status: Married    Spouse name: Not on file   Number of children: 3   Years of education: Not on file   Highest education level: Not on file  Occupational History   Not on file  Tobacco Use   Smoking status: Never    Passive exposure: Past   Smokeless tobacco: Never  Vaping Use   Vaping status: Never Used  Substance and Sexual Activity   Alcohol use: Yes    Comment: couple of beers a day   Drug use: No   Sexual activity: Not on file  Other Topics Concern   Not on file  Social History Narrative   Not on file   Social Drivers of Health   Financial Resource Strain: Not on file  Food Insecurity: Not on file  Transportation Needs: Not on file  Physical Activity: Not on file  Stress: Not on file  Social Connections: Not on file  Intimate Partner Violence: Not on file    History reviewed. No pertinent family history.  Review of Systems:  As stated in the HPI and otherwise negative.   BP 102/68   Pulse 88   Ht 5' 9 (1.753 m)   Wt 237 lb (107.5 kg)   SpO2 97%   BMI 35.00 kg/m   Physical Examination: General: Well developed, well nourished, NAD  HEENT: OP clear, mucus membranes moist  SKIN: warm, dry. No rashes. Neuro: No focal deficits  Musculoskeletal: Muscle strength 5/5 all ext  Psychiatric: Mood and affect normal  Neck: No JVD, no carotid bruits, no thyromegaly, no lymphadenopathy.  Lungs:Clear  bilaterally, no wheezes, rhonci, crackles Cardiovascular: Regular rate and rhythm. No murmurs, gallops or rubs. Abdomen:Soft. Bowel sounds present. Non-tender.  Extremities: No lower extremity edema. Pulses are 2 + in the bilateral DP/PT.  EKG:  EKG is ordered today. The ekg ordered today demonstrates  EKG Interpretation Date/Time:  Monday January 24 2024 09:29:34 EDT Ventricular Rate:  77 PR Interval:  148 QRS Duration:  80 QT Interval:  386 QTC Calculation: 436 R Axis:   1  Text Interpretation: Normal sinus rhythm Confirmed by Verlin Bruckner [phone]) on 01/24/2024 9:35:06 AM   Recent Labs: No results found for requested labs within last 365 days.   Lipid Panel No results found for: CHOL, TRIG, HDL, CHOLHDL, VLDL, LDLCALC, LDLDIRECT   Wt Readings from Last 3 Encounters:  01/24/24 237 lb (107.5 kg)  11/04/22 222 lb 6.4 oz (100.9 kg)  05/26/22 234 lb (106.1 kg)    Assessment and Plan:   1. History of SVT: No palpitations. Continue Toprol   2. HTN: BP is well controlled. Continue Norvasc, hydrochlorothiazide and metoprolol .   Labs/ tests ordered today include:  Orders Placed This Encounter  Procedures   EKG 12-Lead   Disposition:   F/U with me in on year   Signed, Bruckner Verlin, MD, [facility] 01/24/2024 10:44 AM    [facility] [address] [place], [place], [place]  [address] Phone: [phone]; Fax: [phone]

## 2024-01-24 NOTE — Patient Instructions (Signed)

## 2024-01-28 DIAGNOSIS — E785 Hyperlipidemia, unspecified: Secondary | ICD-10-CM | POA: Diagnosis not present

## 2024-01-28 DIAGNOSIS — K219 Gastro-esophageal reflux disease without esophagitis: Secondary | ICD-10-CM | POA: Diagnosis not present

## 2024-01-28 DIAGNOSIS — Z125 Encounter for screening for malignant neoplasm of prostate: Secondary | ICD-10-CM | POA: Diagnosis not present

## 2024-01-28 DIAGNOSIS — Z23 Encounter for immunization: Secondary | ICD-10-CM | POA: Diagnosis not present

## 2024-01-28 DIAGNOSIS — Z0184 Encounter for antibody response examination: Secondary | ICD-10-CM | POA: Diagnosis not present

## 2024-01-28 DIAGNOSIS — I10 Essential (primary) hypertension: Secondary | ICD-10-CM | POA: Diagnosis not present

## 2024-01-28 DIAGNOSIS — Z0000 Encounter for general adult medical examination without abnormal findings: Secondary | ICD-10-CM | POA: Diagnosis not present

## 2024-01-28 DIAGNOSIS — R7401 Elevation of levels of liver transaminase levels: Secondary | ICD-10-CM | POA: Diagnosis not present

## 2024-02-04 ENCOUNTER — Other Ambulatory Visit: Payer: Self-pay | Admitting: Cardiovascular Disease

## 2024-02-08 DIAGNOSIS — M9901 Segmental and somatic dysfunction of cervical region: Secondary | ICD-10-CM | POA: Diagnosis not present

## 2024-02-08 DIAGNOSIS — M9902 Segmental and somatic dysfunction of thoracic region: Secondary | ICD-10-CM | POA: Diagnosis not present

## 2024-02-08 DIAGNOSIS — M6240 Contracture of muscle, unspecified site: Secondary | ICD-10-CM | POA: Diagnosis not present

## 2024-02-08 DIAGNOSIS — M9903 Segmental and somatic dysfunction of lumbar region: Secondary | ICD-10-CM | POA: Diagnosis not present

## 2024-03-07 DIAGNOSIS — M9902 Segmental and somatic dysfunction of thoracic region: Secondary | ICD-10-CM | POA: Diagnosis not present

## 2024-03-07 DIAGNOSIS — M9901 Segmental and somatic dysfunction of cervical region: Secondary | ICD-10-CM | POA: Diagnosis not present

## 2024-03-07 DIAGNOSIS — M6240 Contracture of muscle, unspecified site: Secondary | ICD-10-CM | POA: Diagnosis not present

## 2024-03-07 DIAGNOSIS — M9903 Segmental and somatic dysfunction of lumbar region: Secondary | ICD-10-CM | POA: Diagnosis not present

## 2024-03-13 ENCOUNTER — Ambulatory Visit: Admitting: Cardiovascular Disease

## 2024-03-28 DIAGNOSIS — M6240 Contracture of muscle, unspecified site: Secondary | ICD-10-CM | POA: Diagnosis not present

## 2024-03-28 DIAGNOSIS — M9903 Segmental and somatic dysfunction of lumbar region: Secondary | ICD-10-CM | POA: Diagnosis not present

## 2024-03-28 DIAGNOSIS — M9902 Segmental and somatic dysfunction of thoracic region: Secondary | ICD-10-CM | POA: Diagnosis not present

## 2024-03-28 DIAGNOSIS — M9901 Segmental and somatic dysfunction of cervical region: Secondary | ICD-10-CM | POA: Diagnosis not present

## 2024-04-11 DIAGNOSIS — M25561 Pain in right knee: Secondary | ICD-10-CM | POA: Diagnosis not present

## 2024-04-11 DIAGNOSIS — M222X1 Patellofemoral disorders, right knee: Secondary | ICD-10-CM | POA: Diagnosis not present

## 2024-04-12 DIAGNOSIS — F411 Generalized anxiety disorder: Secondary | ICD-10-CM | POA: Diagnosis not present

## 2024-04-12 DIAGNOSIS — F331 Major depressive disorder, recurrent, moderate: Secondary | ICD-10-CM | POA: Diagnosis not present
